# Patient Record
Sex: Male | Born: 1980 | Race: White | Hispanic: No | Marital: Married | Smoking: Never smoker
Health system: Southern US, Community
[De-identification: ages and names within clinical notes are randomized; demographics above are authoritative.]

## PROBLEM LIST (undated history)

## (undated) DIAGNOSIS — E079 Disorder of thyroid, unspecified: Secondary | ICD-10-CM

## (undated) HISTORY — DX: Disorder of thyroid, unspecified: E07.9

## (undated) HISTORY — DX: Gilbert syndrome: E80.4

## (undated) HISTORY — PX: KNEE SURGERY: SHX244

---

## 2017-05-20 ENCOUNTER — Encounter: Payer: Self-pay | Admitting: Osteopathic Medicine

## 2017-05-20 ENCOUNTER — Ambulatory Visit (INDEPENDENT_AMBULATORY_CARE_PROVIDER_SITE_OTHER): Payer: Managed Care, Other (non HMO) | Admitting: Osteopathic Medicine

## 2017-05-20 VITALS — BP 129/82 | HR 67 | Ht 73.0 in | Wt 231.0 lb

## 2017-05-20 DIAGNOSIS — S93402A Sprain of unspecified ligament of left ankle, initial encounter: Secondary | ICD-10-CM | POA: Insufficient documentation

## 2017-05-20 DIAGNOSIS — Z0000 Encounter for general adult medical examination without abnormal findings: Secondary | ICD-10-CM

## 2017-05-20 DIAGNOSIS — Z9852 Vasectomy status: Secondary | ICD-10-CM

## 2017-05-20 DIAGNOSIS — R6882 Decreased libido: Secondary | ICD-10-CM | POA: Insufficient documentation

## 2017-05-20 DIAGNOSIS — Z23 Encounter for immunization: Secondary | ICD-10-CM

## 2017-05-20 HISTORY — DX: Gilbert syndrome: E80.4

## 2017-05-20 NOTE — Progress Notes (Signed)
HPI: Ricky Marshall is a 36 y.o. male  who presents to [facility] today, 05/20/17,  for chief complaint of:  Chief Complaint  Patient presents with  . Establish Care    Annual, sprained left ankle    Here to establish care. 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 Brother is also a DO and an Secondary school teacher in [place]. He is married.   Patient here for annual physical / wellness exam.  See preventive care reviewed as below.  Recent labs/previous records: none available   Additional concerns today include:   Ankle injury . Context: was running and jumped off a retaining wall and landed on L foot, rolled ankle . Location: left ankle, lateral  . Quality: sore, no sharp pain  . Duration: <1 day . Modifying factors: iced it and tried Ibuprofen which helped   Low libido: Ongoing for the past year or so since vasectomy last October. Not sure if the 2 are related but seems to be getting a bit worse over that time. He is noticing some erectile dysfunction issues as well. He is to sit in sex and able to achieve erection at times but not as much as he used to.     Past medical, surgical, social and family history reviewed: Patient Active Problem List   Diagnosis Date Noted  . Status post vasectomy 05/20/2017  . Decreased libido 05/20/2017  . Mild sprain of left ankle 05/20/2017  . 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 KNEE SURGERY Right 2003, 2013     Social History  Substance Use Topics  . Smoking status: Never Smoker  . Smokeless tobacco: Never Used  . Alcohol use Not on file   Family History  Problem Relation Age of Onset  . Hypertension Father   . Hypertension Brother   . Thyroid disease Maternal Aunt   . Thyroid disease Maternal Grandmother   . Stroke Maternal Grandmother   . Diabetes Paternal Grandfather   . Cancer Paternal Grandfather      Current  medication list and allergy/intolerance information reviewed:   Current Outpatient Prescriptions  Medication Sig Dispense Refill  . SUMAtriptan (IMITREX) 25 MG tablet Take 25 mg by mouth every 2 (two) hours as needed for migraine. May repeat in 2 hours if headache persists or recurs.     No current facility-administered medications for this visit.    Allergies  Allergen Reactions  . Erythromycin Hives      Review of Systems:  Constitutional:  No  fever, no chills, No recent illness, No unintentional weight changes. No significant fatigue.   HEENT: No  headache, no vision change, no hearing change, No sore throat, No  sinus pressure  Cardiac: No  chest pain, No  pressure, No palpitations, No  Orthopnea  Respiratory:  No  shortness of breath. No  Cough  Gastrointestinal: No  abdominal pain, No  nausea, No  vomiting,  No  blood in stool, No  diarrhea, No  constipation   Musculoskeletal: +new myalgia/arthralgia  Genitourinary: No  incontinence, No  abnormal genital bleeding, No abnormal genital discharge  Skin: No  Rash, No other wounds/concerning lesions  Hem/Onc: No  easy bruising/bleeding  Endocrine: No cold intolerance,  No heat intolerance. No polyuria/polydipsia/polyphagia   Neurologic: No  weakness, No  dizziness  Psychiatric: No  concerns with depression, No  concerns with anxiety, No sleep problems, No mood problems  Exam:  BP 129/82   Pulse 67   Ht 6\' 1"  (1.854 m)   Wt 231 lb (104.8 kg)   BMI 30.48 kg/m   Constitutional: VS see above. General Appearance: alert, well-developed, well-nourished, NAD  Eyes: Normal lids and conjunctive, non-icteric sclera  Ears, Nose, Mouth, Throat: MMM, Normal external inspection ears/nares/mouth/lips/gums. TM normal bilaterally. Pharynx/tonsils no erythema, no exudate. Nasal mucosa normal.   Neck: No masses, trachea midline. No thyroid enlargement. No tenderness/mass appreciated. No lymphadenopathy  Respiratory: Normal  respiratory effort. no wheeze, no rhonchi, no rales  Cardiovascular: S1/S2 normal, no murmur, no rub/gallop auscultated. RRR. No lower extremity edema. .  Gastrointestinal: Nontender, no masses. No hepatomegaly, no splenomegaly.   Musculoskeletal: Gait normal. No clubbing/cyanosis of digits. Mild swelling of left lateral ankle without ecchymosis/effusion. No tenderness at lateral malleolus, calf squeeze test negative  Neurological: Normal balance/coordination. No tremor.   Skin: warm, dry, intact. No rash/ulcer. No concerning nevi or subq nodules on limited exam.    Psychiatric: Normal judgment/insight. Normal mood and affect. Oriented x3.     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      MALE PREVENTIVE CARE  updated 05/20/17  ANNUAL SCREENING/COUNSELING  Any changes to health in the past year/since last preventive care physical? See HPI  Diet/Exercise - HEALTHY HABITS DISCUSSED TO DECREASE CV RISK History  Smoking Status  . Never Smoker  Smokeless Tobacco  . Never Used   History  Alcohol use Not on file   Depression screen [facility] 2/9 05/20/2017  Decreased Interest 0  Down, Depressed,  Hopeless 0  PHQ - 2 Score 0    SEXUAL/REPRODUCTIVE HEALTH  Sexually active in the past year? - Yes with male.  STI testing needed/desired today? - no  Any concerns with testosterone/libido? - yes  INFECTIOUS DISEASE SCREENING  HIV - needs  GC/CT - does not need  HepC - does not need  TB - does not need  CANCER SCREENING  Lung - does not need  Colon - does not need  Prostate - does not need  OTHER DISEASE SCREENING  Lipid - needs  DM2 - needs  AAA - does not need  Osteoporosis - does not need  ADULT VACCINATION  Influenza - annual vaccine recommended  Td - booster every 10 years - states he had this in 2010  PCV13 - was not indicated  PPSV23 - was not indicated Immunization History  Administered Date(s) Administered  . Influenza,inj,Quad PF,6+ Mos 05/20/2017       Visit summary with medication list and pertinent instructions was printed for patient to review. All questions at time of visit were answered - patient instructed to contact office with any additional concerns.  ER/RTC precautions were reviewed with the patient. Follow-up plan: No Follow-up on file.  Note: Total time spent on problem-based portion of viist 30 minutes, greater than 50% of that time visit was spent face-to-face counseling and coordinating care for the following: Need for immunization against influenza, Gilbert syndrome, Mild sprain of left ankle, initial encounter, Decreased libido, and Status post vasectomy

## 2017-05-20 NOTE — Patient Instructions (Signed)
Plan:  See printed info for ankle care  Labs today for routine screening as well as workup of libido issues   If testosterone low, will require further testing and follow-up   Either way, let's check up in another 4-6 weeks or so to go over labs and recheck libido issues, come up with a plan based on labs!

## 2017-05-24 LAB — COMPLETE METABOLIC PANEL WITH GFR
AG Ratio: 1.4 (calc) (ref 1.0–2.5)
ALKALINE PHOSPHATASE (APISO): 70 U/L (ref 40–115)
ALT: 17 U/L (ref 9–46)
AST: 16 U/L (ref 10–40)
Albumin: 4.3 g/dL (ref 3.6–5.1)
BUN: 13 mg/dL (ref 7–25)
CO2: 28 mmol/L (ref 20–32)
CREATININE: 0.99 mg/dL (ref 0.60–1.35)
Calcium: 9.5 mg/dL (ref 8.6–10.3)
Chloride: 105 mmol/L (ref 98–110)
GFR, Est African American: 113 mL/min/{1.73_m2} (ref >=60)
GFR, Est Non African American: 98 mL/min/{1.73_m2} (ref >=60)
GLUCOSE: 89 mg/dL (ref 65–99)
Globulin: 3 g/dL (calc) (ref 1.9–3.7)
Potassium: 4.7 mmol/L (ref 3.5–5.3)
Sodium: 140 mmol/L (ref 135–146)
Total Bilirubin: 1.3 mg/dL — ABNORMAL HIGH (ref 0.2–1.2)
Total Protein: 7.3 g/dL (ref 6.1–8.1)

## 2017-05-24 LAB — CBC
HCT: 46.3 % (ref 38.5–50.0)
HEMOGLOBIN: 15.8 g/dL (ref 13.2–17.1)
MCH: 30 pg (ref 27.0–33.0)
MCHC: 34.1 g/dL (ref 32.0–36.0)
MCV: 87.9 fL (ref 80.0–100.0)
MPV: 10.9 fL (ref 7.5–12.5)
Platelets: 257 10*3/uL (ref 140–400)
RBC: 5.27 10*6/uL (ref 4.20–5.80)
RDW: 11.9 % (ref 11.0–15.0)
WBC: 6.3 10*3/uL (ref 3.8–10.8)

## 2017-05-24 LAB — T4, FREE: FREE T4: 1.1 ng/dL (ref 0.8–1.8)

## 2017-05-24 LAB — LIPID PANEL
CHOL/HDL RATIO: 3.4 (calc) (ref <5.0)
Cholesterol: 175 mg/dL (ref <200)
HDL: 52 mg/dL (ref >40)
LDL CHOLESTEROL (CALC): 104 mg/dL — AB
NON-HDL CHOLESTEROL (CALC): 123 mg/dL (ref <130)
Triglycerides: 91 mg/dL (ref <150)

## 2017-05-24 LAB — HIV ANTIBODY (ROUTINE TESTING W REFLEX): HIV 1&2 Ab, 4th Generation: NONREACTIVE

## 2017-05-24 LAB — TSH: TSH: 1.84 m[IU]/L (ref 0.40–4.50)

## 2017-05-24 LAB — VITAMIN D 25 HYDROXY (VIT D DEFICIENCY, FRACTURES): VIT D 25 HYDROXY: 41 ng/mL (ref 30–100)

## 2017-05-24 LAB — TESTOSTERONE: TESTOSTERONE: 550 ng/dL (ref 250–827)

## 2017-05-25 ENCOUNTER — Telehealth: Payer: Self-pay

## 2017-05-25 ENCOUNTER — Encounter: Payer: Self-pay | Admitting: Osteopathic Medicine

## 2017-05-25 MED ORDER — SUMATRIPTAN SUCCINATE 100 MG PO TABS: 50.0000 mg | ORAL_TABLET | Freq: Once | ORAL | 1 refills | Status: DC

## 2017-05-25 NOTE — Telephone Encounter (Signed)
Patient called stated that he was given Imitrex 25 mg when he was in for his office visit but he was previously on the 100 mg. Patient is requesting a dose change. Please advise. Rhonda Cunningham,CMA

## 2017-05-26 ENCOUNTER — Other Ambulatory Visit: Payer: Self-pay | Admitting: Osteopathic Medicine

## 2017-05-26 MED ORDER — SILDENAFIL CITRATE 20 MG PO TABS: 20.0000 mg | ORAL_TABLET | Freq: Every day | ORAL | 1 refills | Status: DC | PRN

## 2017-05-26 NOTE — Progress Notes (Signed)
ED Rx

## 2017-06-06 DIAGNOSIS — R7989 Other specified abnormal findings of blood chemistry: Secondary | ICD-10-CM | POA: Insufficient documentation

## 2017-06-23 ENCOUNTER — Encounter: Payer: Self-pay | Admitting: Osteopathic Medicine

## 2017-06-23 ENCOUNTER — Ambulatory Visit (INDEPENDENT_AMBULATORY_CARE_PROVIDER_SITE_OTHER): Payer: Managed Care, Other (non HMO) | Admitting: Osteopathic Medicine

## 2017-06-23 VITALS — BP 118/86 | HR 98 | Ht 72.0 in | Wt 229.0 lb

## 2017-06-23 DIAGNOSIS — R6882 Decreased libido: Secondary | ICD-10-CM | POA: Diagnosis not present

## 2017-06-23 DIAGNOSIS — Z9852 Vasectomy status: Secondary | ICD-10-CM | POA: Diagnosis not present

## 2017-06-23 NOTE — Progress Notes (Signed)
HPI: Ricky Marshall is a 36 y.o. male  who presents to [facility] today, 06/23/17,  for chief complaint of:  Chief Complaint  Patient presents with  . 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 Brother is also a DO and an Secondary school teacher in [place]. He is married.   Patient was here about a month ago to establish care. At that point had some concerns about an ankle injury and low libido.  Libido: At this point has been ongoing for the past year or so, noticed after vasectomy last October.He is noticing some erectile dysfunction issues as well. He is interested in sex and able to achieve erection at times but not as much as he used to. Labs showed normal sugars, normal renal/hepatic function, no anemia, no thyroid disorder, testosterone within normal range. No depression. Thinks he might be getting anxious.    Ankle injury: doing better.     Past medical, surgical, social and family history reviewed: Patient Active Problem List   Diagnosis Date Noted  . Low vitamin D level 06/06/2017  . Status post vasectomy 05/20/2017  . Decreased libido 05/20/2017  . Mild sprain of left ankle 05/20/2017  . 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 KNEE SURGERY Right 2003, 2013     Social History  Substance Use Topics  . Smoking status: Never Smoker  . Smokeless tobacco: Never Used  . Alcohol use Not on file   Family History  Problem Relation Age of Onset  . Hypertension Father   . Hypertension Brother   . Thyroid disease Maternal Aunt   . Thyroid disease Maternal Grandmother   . Stroke Maternal Grandmother   . Diabetes Paternal Grandfather   . Cancer Paternal Grandfather      Current medication list and allergy/intolerance information reviewed:   Current Outpatient Prescriptions  Medication Sig Dispense Refill  . sildenafil (REVATIO) 20 MG  tablet Take 1-5 tablets (20-100 mg total) by mouth daily as needed (prior to sex. Use lowest effective dose). 90 tablet 1  . SUMAtriptan (IMITREX) 100 MG tablet Take 0.5-1 tablets (50-100 mg total) by mouth once. May repeat in 2 hours if headache persists or recurs. 20 tablet 1   No current facility-administered medications for this visit.    Allergies  Allergen Reactions  . Erythromycin Hives      Review of Systems:  Constitutional: No recent illness  Cardiac: No  chest pain, No  pressure  Respiratory:  No  shortness of breath.  Gastrointestinal: No  abdominal pain  Neurologic: No  weakness, No  dizziness  Exam:  BP 118/86   Pulse 98   Ht 6' (1.829 m)   Wt 229 lb (103.9 kg)   BMI 31.06 kg/m   Constitutional: VS see above. General Appearance: alert, well-developed, well-nourished, NAD  Eyes: Normal lids and conjunctive, non-icteric sclera  Neck: No masses, trachea midline.   Respiratory: Normal respiratory effort.  Musculoskeletal: Gait normal.   Neurological: Normal balance/coordination.   Psychiatric: Normal judgment/insight. Normal mood and affect. Oriented x3.   Recent Results (from the past 2160 hour(s))  CBC     Status: None   Collection Time: 05/23/17  8:19 AM  Result Value Ref Range   WBC 6.3 3.8 - 10.8 Thousand/uL   RBC 5.27 4.20 - 5.80 Million/uL   Hemoglobin 15.8 13.2 - 17.1 g/dL   HCT 46.3  38.5 - 50.0 %   MCV 87.9 80.0 - 100.0 fL   MCH 30.0 27.0 - 33.0 pg   MCHC 34.1 32.0 - 36.0 g/dL   RDW 11.9 11.0 - 15.0 %   Platelets 257 140 - 400 Thousand/uL   MPV 10.9 7.5 - 12.5 fL  Lipid panel     Status: Abnormal   Collection Time: 05/23/17  8:19 AM  Result Value Ref Range   Cholesterol 175 <200 mg/dL   HDL 52 >40 mg/dL   Triglycerides 91 <150 mg/dL   LDL Cholesterol (Calc) 104 (H) mg/dL (calc)    Comment: Reference range: <100 . Desirable range <100 mg/dL for primary prevention;   <70 mg/dL for patients with CHD or diabetic patients  with > or  = 2 CHD risk factors. Marland Kitchen LDL-C is now calculated using the Martin-Hopkins  calculation, which is a validated novel method providing  better accuracy than the Friedewald equation in the  estimation of LDL-C.  Cresenciano Genre et al. Annamaria Helling. 5361;443(15): 2061-2068  (http://education.[facility].com/faq/FAQ164)    Total CHOL/HDL Ratio 3.4 <5.0 (calc)   Non-HDL Cholesterol (Calc) 123 <130 mg/dL (calc)    Comment: For patients with diabetes plus 1 major ASCVD risk  factor, treating to a non-HDL-C goal of <100 mg/dL  (LDL-C of <70 mg/dL) is considered a therapeutic  option.   COMPLETE METABOLIC PANEL WITH GFR     Status: Abnormal   Collection Time: 05/23/17  8:19 AM  Result Value Ref Range   Glucose, Bld 89 65 - 99 mg/dL    Comment: .            Fasting reference interval .    BUN 13 7 - 25 mg/dL   Creat 0.99 0.60 - 1.35 mg/dL   GFR, Est Non African American 98 > OR = 60 mL/min/1.20m2   GFR, Est African American 113 > OR = 60 mL/min/1.14m2   BUN/Creatinine Ratio NOT APPLICABLE 6 - 22 (calc)   Sodium 140 135 - 146 mmol/L   Potassium 4.7 3.5 - 5.3 mmol/L   Chloride 105 98 - 110 mmol/L   CO2 28 20 - 32 mmol/L   Calcium 9.5 8.6 - 10.3 mg/dL   Total Protein 7.3 6.1 - 8.1 g/dL   Albumin 4.3 3.6 - 5.1 g/dL   Globulin 3.0 1.9 - 3.7 g/dL (calc)   AG Ratio 1.4 1.0 - 2.5 (calc)   Total Bilirubin 1.3 (H) 0.2 - 1.2 mg/dL   Alkaline phosphatase (APISO) 70 40 - 115 U/L   AST 16 10 - 40 U/L   ALT 17 9 - 46 U/L  TSH     Status: None   Collection Time: 05/23/17  8:19 AM  Result Value Ref Range   TSH 1.84 0.40 - 4.50 mIU/L  T4, free     Status: None   Collection Time: 05/23/17  8:19 AM  Result Value Ref Range   Free T4 1.1 0.8 - 1.8 ng/dL  VITAMIN D 25 Hydroxy (Vit-D Deficiency, Fractures)     Status: None   Collection Time: 05/23/17  8:19 AM  Result Value Ref Range   Vit D, 25-Hydroxy 41 30 - 100 ng/mL    Comment: Vitamin D Status         25-OH Vitamin D: . Deficiency:                     <20 ng/mL Insufficiency:             20 -  29 ng/mL Optimal:                 > or = 30 ng/mL . For 25-OH Vitamin D testing on patients on  D2-supplementation and patients for whom quantitation  of D2 and D3 fractions is required, the QuestAssureD(TM) 25-OH VIT D, (D2,D3), LC/MS/MS is recommended: order  code 959 [id number] (patients >62yrs). . For more information on this test, go to: http://education.[facility].com/faq/FAQ163 (This link is being provided for  informational/educational purposes only.)   Testosterone     Status: None   Collection Time: 05/23/17  8:19 AM  Result Value Ref Range   Testosterone 550 250 - 827 ng/dL  HIV antibody     Status: None   Collection Time: 05/23/17  8:19 AM  Result Value Ref Range   HIV 1&2 Ab, 4th Generation NON-REACTIVE NON-REACTI    Comment: HIV-1 antigen and HIV-1/HIV-2 antibodies were not detected. There is no laboratory evidence of HIV infection. Marland Kitchen PLEASE NOTE: This information has been disclosed to you from records whose confidentiality may be protected by state law.  If your state requires such protection, then the state law prohibits you from making any further disclosure of the information without the specific written consent of the person to whom it pertains, or as otherwise permitted by law. A general authorization for the release of medical or other information is NOT sufficient for this purpose. . For additional information please refer to http://education.[facility].com/faq/FAQ106 (This link is being provided for informational/ educational purposes only.) . Marland Kitchen The performance of this assay has not been clinically validated in patients less than 90 years old. .      ASSESSMENT/PLAN:   Decreased libido - trial sildenafil, consider refer sex therapy/counseling, consider urology referral   Status post vasectomy  Rosanna Randy syndrome       Visit summary with medication list and pertinent instructions was printed  for patient to review. All questions at time of visit were answered - patient instructed to contact office with any additional concerns. ER/RTC precautions were reviewed with the patient. Follow-up plan: Return if symptoms worsen or fail to improve.   Total time spent 15 minutes, greater than 50% of the visit was face to face counseling and coordinating care for diagnosis of The primary encounter diagnosis was Decreased libido. Diagnoses of Status post vasectomy and Rosanna Randy syndrome were also pertinent to this visit. Marland Kitchen

## 2017-07-12 ENCOUNTER — Ambulatory Visit (HOSPITAL_BASED_OUTPATIENT_CLINIC_OR_DEPARTMENT_OTHER)
Admission: RE | Admit: 2017-07-12 | Discharge: 2017-07-12 | Disposition: A | Discharge status: Home / Self Care | Payer: Managed Care, Other (non HMO) | Source: Ambulatory Visit | Attending: Osteopathic Medicine | Admitting: Osteopathic Medicine

## 2017-07-12 ENCOUNTER — Ambulatory Visit (INDEPENDENT_AMBULATORY_CARE_PROVIDER_SITE_OTHER): Payer: Managed Care, Other (non HMO) | Admitting: Osteopathic Medicine

## 2017-07-12 ENCOUNTER — Encounter: Payer: Self-pay | Admitting: Osteopathic Medicine

## 2017-07-12 VITALS — BP 122/82 | HR 66 | Wt 232.0 lb

## 2017-07-12 DIAGNOSIS — N50812 Left testicular pain: Secondary | ICD-10-CM

## 2017-07-12 DIAGNOSIS — Z8639 Personal history of other endocrine, nutritional and metabolic disease: Secondary | ICD-10-CM | POA: Insufficient documentation

## 2017-07-12 NOTE — Patient Instructions (Signed)
Varicocele A varicocele is a swelling of veins in the scrotum. The scrotum is the sac that contains the testicles. Varicoceles can occur on either side of the scrotum, but they are more common on the left side. They occur most often in teenage boys and young men. In most cases, varicoceles are not a serious problem. They are usually small and painless and do not require treatment. Tests may be done to confirm the diagnosis. Treatment may be needed if:  A varicocele is large, causes a lot of pain, or causes pain when exercising.  Varicoceles are found on both sides of the scrotum.  The testicle on the opposite side is absent or not normal.  A varicocele causes a decrease in the size of the testicle in a growing adolescent.  The person has fertility problems.  What are the causes? This condition is the result of valves in the veins not working properly. Valves in the veins help to return blood from the scrotum and testicles to the heart. If these valves do not work well, blood flows backward and backs up into the veins, which causes the veins to swell. This is similar to what happens when varicose veins form in the leg. What are the signs or symptoms? Most varicoceles do not cause any symptoms. If symptoms do occur, they may include:  Swelling on one side of the scrotum. The swelling may be more obvious when you are standing up.  A lumpy feeling in the scrotum.  A heavy feeling on one side of the scrotum.  A dull ache in the scrotum, especially after exercise or prolonged standing or sitting.  Slower growth or reduced size of the testicle on the side of the varicocele (in young males).  Problems with fertility. These can occur if the testicle does not grow normally.  How is this diagnosed? This condition may be diagnosed with a physical exam. You may also have an imaging test, called an ultrasound, to confirm the diagnosis and to help rule out other causes of the swelling. How is this  treated? Treatment is usually not needed for this condition. If you have any pain, your health care provider may prescribe or recommend medicine to help relieve it. You may need regular exams so your health care provider can monitor the varicocele to ensure that it does not cause problems. When further treatment is needed, it may involve one of these options:  Varicocelectomy. This is a surgery in which the swollen veins are tied off so that the flow of blood goes to other veins instead.  Embolization. In this procedure, a small tube (catheter) is used to place metal coils or other blocking items in the veins. This cuts off the blood flow to the swollen veins.  Follow these instructions at home:  Take medicines only as directed by your health care provider.  Wear supportive underwear.  Use an athletic supporter for sports.  Keep all follow-up visits as directed by your health care provider. This is important. Contact a health care provider if:  Your pain is increasing.  You have redness in the affected area.  You have swelling that does not decrease when you are lying down.  One of your testicles is smaller than the other.  Your testicle becomes enlarged, swollen, or painful. This information is not intended to replace advice given to you by your health care provider. Make sure you discuss any questions you have with your health care provider. Document Released: 12/06/2000 Document Revised: 02/11/2016  Document Reviewed: 08/07/2014 Elsevier Interactive Patient Education  2018 [employer]  

## 2017-07-12 NOTE — Progress Notes (Signed)
HPI: Ricky Marshall is a 36 y.o. 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  who presents to [facility] today, 07/12/17,  for chief complaint of:  Chief Complaint  Patient presents with  . Other    pain at testicles    . Location: lower abdomen, feels like connected to testicle on L . Marland Kitchen Quality: feels like soreness when kicked, but obviously no trauma. Yesterday was swollen and war, today is better but still tender.  . Duration: 1 day  . Modifying factors: Ibuprofen and briefs > boxers helped  . Context: no history of lifting anything heavy . Assoc signs/symptoms: no fever/chills. No trauma, no lifting. No     Past medical, surgical, social and family history reviewed:  Patient Active Problem List   Diagnosis Date Noted  . Low vitamin D level 06/06/2017  . Status post vasectomy 05/20/2017  . Decreased libido 05/20/2017  . Mild sprain of left ankle 05/20/2017  . Rosanna Randy syndrome 05/20/2017    Past Surgical History:  Procedure Laterality Date  . KNEE SURGERY Right 2003, 2013    Social History  Substance Use Topics  . Smoking status: Never Smoker  . Smokeless tobacco: Never Used  . Alcohol use Not on file    Family History  Problem Relation Age of Onset  . Hypertension Father   . Hypertension Brother   . Thyroid disease Maternal Aunt   . Thyroid disease Maternal Grandmother   . Stroke Maternal Grandmother   . Diabetes Paternal Grandfather   . Cancer Paternal Grandfather      Current medication list and allergy/intolerance information reviewed:    Current Outpatient Prescriptions  Medication Sig Dispense Refill  . sildenafil (REVATIO) 20 MG tablet Take 1-5 tablets (20-100 mg total) by mouth daily as needed (prior to sex. Use lowest effective dose). 90 tablet 1  . SUMAtriptan (IMITREX) 100 MG tablet Take 0.5-1 tablets (50-100 mg total) by mouth once. May repeat in 2 hours if  headache persists or recurs. 20 tablet 1   No current facility-administered medications for this visit.     Allergies  Allergen Reactions  . Erythromycin Hives      Review of Systems:  Constitutional:  No  fever, no chills  Cardiac: No  chest pain  Respiratory:  No  shortness of breath.   Gastrointestinal: +lower abdominal pain, No  nausea, No  vomiting,  No  blood in stool, No  diarrhea, No  constipation   Musculoskeletal: No new myalgia/arthralgia  Genitourinary: No  incontinence, No  abnormal genital bleeding, No abnormal genital discharge  Skin: No  Rash,    Exam:  BP 122/82   Pulse 66   Wt 232 lb (105.2 kg)   BMI 31.46 kg/m   Constitutional: VS see above. General Appearance: alert, well-developed, well-nourished, NAD  Neck: No masses, trachea midline.  Respiratory: Normal respiratory effort.   Gastrointestinal: Nontender, no masses. No hepatomegaly, no splenomegaly. No hernia appreciated. Bowel sounds normal. Rectal exam deferred.   Musculoskeletal: Gait normal.   GU: tenderness L testicle without hernia bilaterally   Skin: warm, dry, intact. No rash/ulcer.   Psychiatric: Normal judgment/insight. Normal mood and affect. Oriented x3.       ASSESSMENT/PLAN: Appreciate assistance from Dr. Darene Lamer with verification of exam. Suspect most likely varicocele possible epididymal abnormality - reassuring that there was improvement and no other associated symptoms of concern.   Testicular pain, left - Plan: US  Scrotum, US SCROTUM DOPPLER     Visit summary with medication list and pertinent instructions was printed for patient to review. All questions at time of visit were answered - patient instructed to contact office with any additional concerns. ER/RTC precautions were reviewed with the patient. Follow-up plan: Return if symptoms worsen or fail to improve.  Note: Total time spent was minutes, greater than 50% of the visit was spent face-to-face counseling and  coordinating care for the following: The encounter diagnosis was Testicular pain, left..  Please note: voice recognition software was used to produce this document, and typos may escape review. Please contact me for any needed clarifications.     Addendum: 07/13/17   US Scrotum  Result Date: 07/12/2017 CLINICAL DATA:  Left scrotal pain beginning yesterday.  Swelling. EXAM: SCROTAL ULTRASOUND DOPPLER ULTRASOUND OF THE TESTICLES TECHNIQUE: Complete ultrasound examination of the testicles, epididymis, and other scrotal structures was performed. Color and spectral Doppler ultrasound were also utilized to evaluate blood flow to the testicles. COMPARISON:  None. FINDINGS: Right testicle Measurements: 5.4 x 2.7 x 4.1 cm, within normal limits. No mass or microlithiasis visualized. Left testicle Measurements: 5.3 x 3.0 x 4.2 cm, within normal limits. No mass or microlithiasis visualized. There is hyperemia to the left testicle compared to the right. Doppler waveforms are within normal limits. Right epididymis:  Normal in size and appearance. Left epididymis:  Normal in size and appearance. Hydrocele:  None visualized. Varicocele:  None visualized. Pulsed Doppler interrogation of both testes demonstrates normal low resistance arterial and venous waveforms bilaterally. IMPRESSION: 1. Hyperemia of the left testicle. This may reflect orchitis. No discrete lesion is present. 2. Normal appearance of the right testicle. 3. Normal arterial and venous waveforms bilaterally. Electronically Signed   By: San Morelle M.D.   On: 07/12/2017 17:10   US Scrotum Doppler  Result Date: 07/12/2017 CLINICAL DATA:  Left scrotal pain beginning yesterday.  Swelling. EXAM: SCROTAL ULTRASOUND DOPPLER ULTRASOUND OF THE TESTICLES TECHNIQUE: Complete ultrasound examination of the testicles, epididymis, and other scrotal structures was performed. Color and spectral Doppler ultrasound were also utilized to evaluate blood flow to  the testicles. COMPARISON:  None. FINDINGS: Right testicle Measurements: 5.4 x 2.7 x 4.1 cm, within normal limits. No mass or microlithiasis visualized. Left testicle Measurements: 5.3 x 3.0 x 4.2 cm, within normal limits. No mass or microlithiasis visualized. There is hyperemia to the left testicle compared to the right. Doppler waveforms are within normal limits. Right epididymis:  Normal in size and appearance. Left epididymis:  Normal in size and appearance. Hydrocele:  None visualized. Varicocele:  None visualized. Pulsed Doppler interrogation of both testes demonstrates normal low resistance arterial and venous waveforms bilaterally. IMPRESSION: 1. Hyperemia of the left testicle. This may reflect orchitis. No discrete lesion is present. 2. Normal appearance of the right testicle. 3. Normal arterial and venous waveforms bilaterally. Electronically Signed   By: San Morelle M.D.   On: 07/12/2017 17:10   Possible orchitis - called patient to discuss results   Called patient - testicle still tender/painful, better a bit but not resolved. No fever. UpToDate reviewed (Evaluation of acute scrotal pain in adults Author: Nani Gasser, MD Jan 08, 2017 updated) recommended Levofloxacin 500 daily for 75+ yo low risk, pt does not engage in anal sex. Will send abx though of course might be viral. If worse/change will send to urology

## 2017-07-13 MED ORDER — LEVOFLOXACIN 500 MG PO TABS: 500.0000 mg | ORAL_TABLET | Freq: Every day | ORAL | 0 refills | Status: DC

## 2018-11-21 IMAGING — US US SCROTUM
1 series · 13 of 25 positions shown · non-contrast
Comparison: None.

CLINICAL DATA: Left scrotal pain beginning yesterday.  Swelling.

EXAM:
SCROTAL ULTRASOUND
DOPPLER ULTRASOUND OF THE TESTICLES
TECHNIQUE: Complete ultrasound examination of the testicles, epididymis, and
other scrotal structures was performed. Color and spectral Doppler
ultrasound were also utilized to evaluate blood flow to the
testicles.

[Series 1: us scrotum · 0.09mm/px · 13 of 31 slices shown]
[im 1/31]
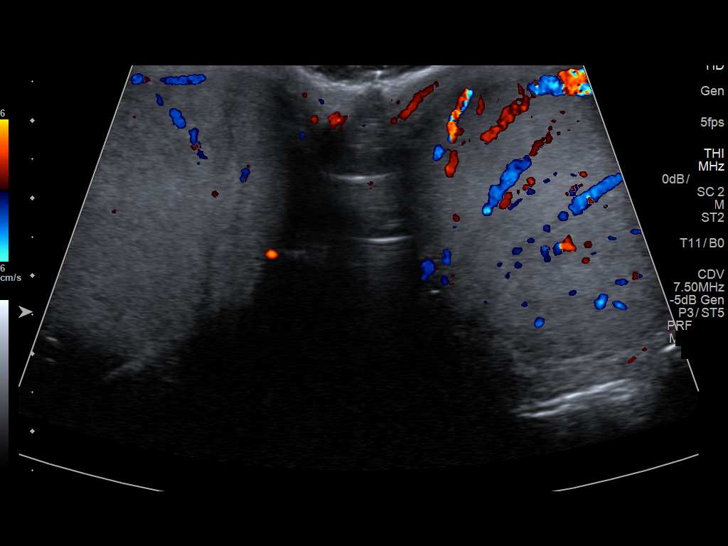
[im 3/31]
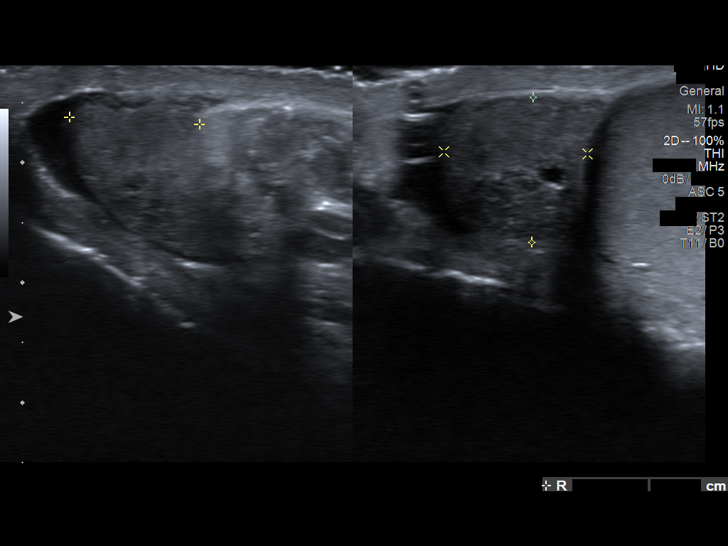
[im 6/31]
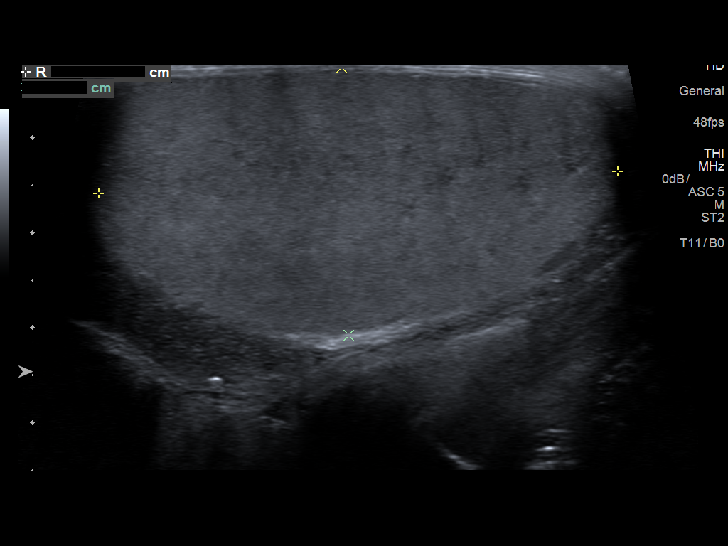
[im 8/31]
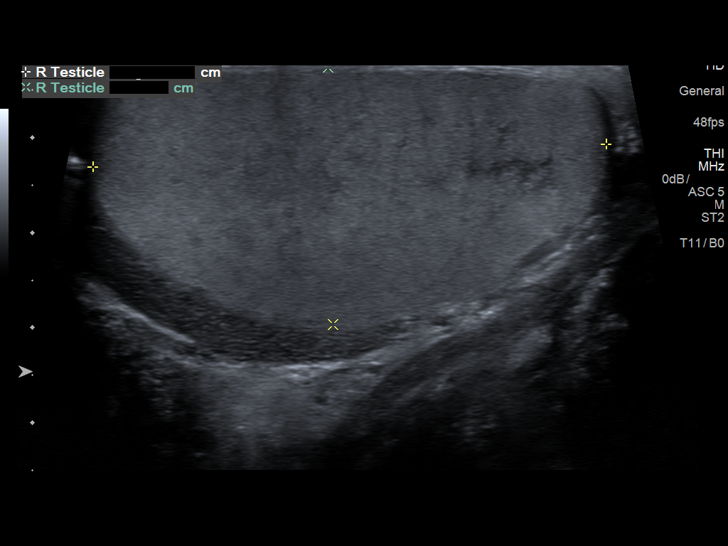
[im 11/31]
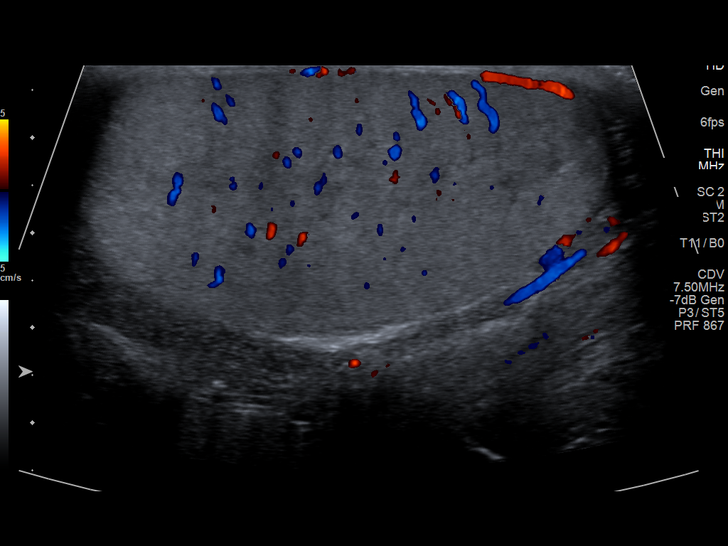
[im 13/31]
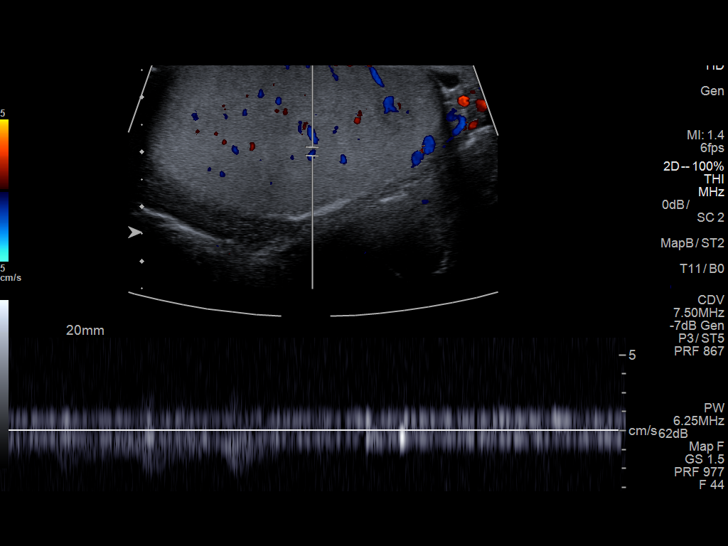
[im 16/31]
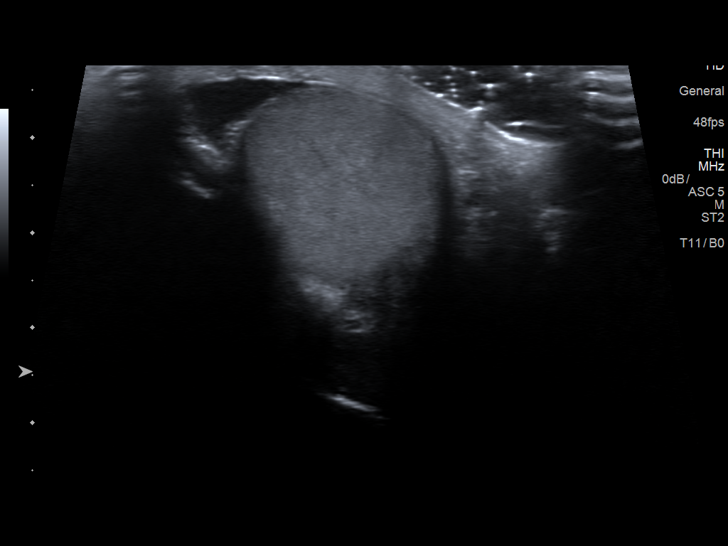
[im 18/31]
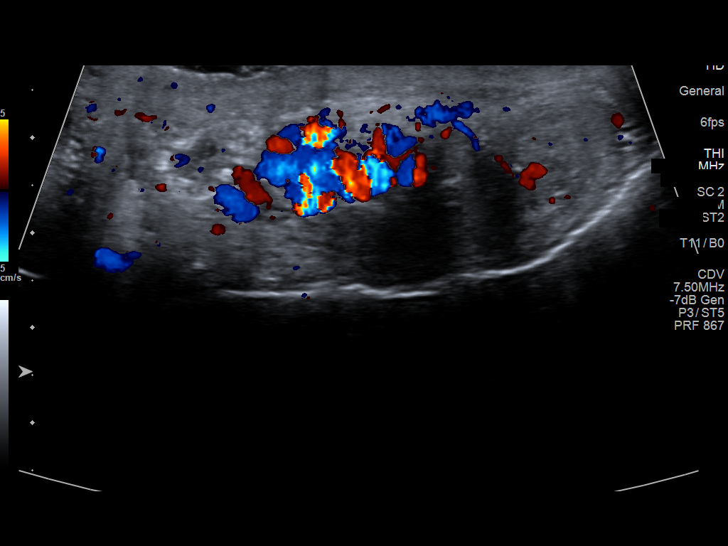
[im 21/31]
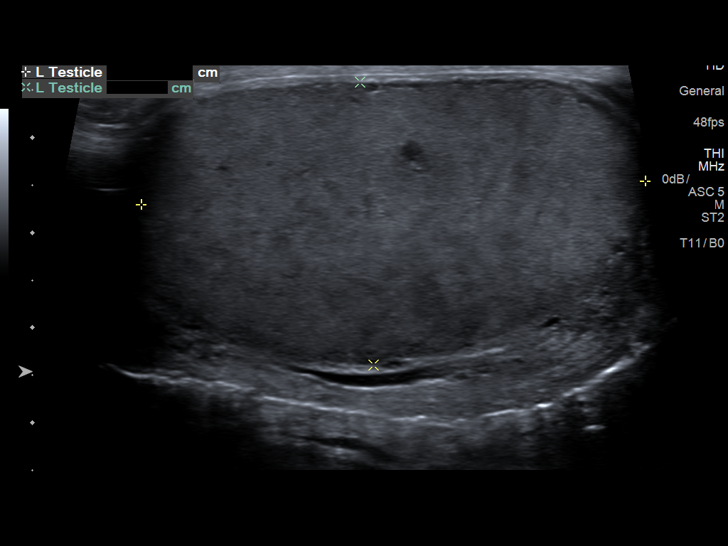
[im 23/31]
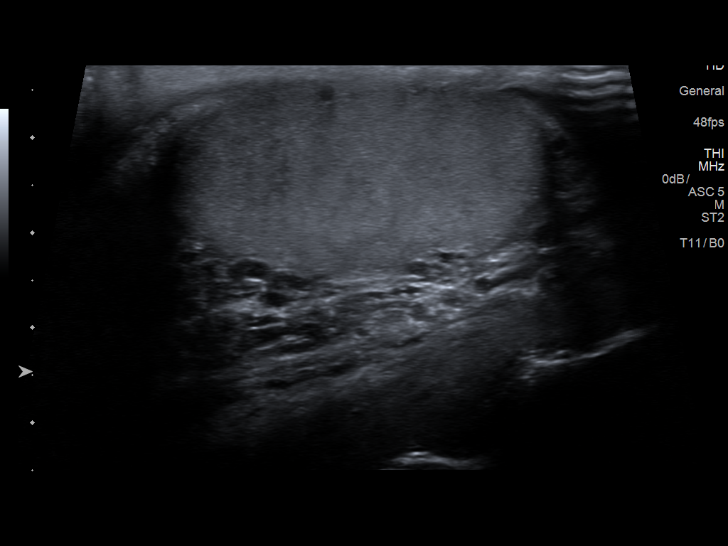
[im 26/31]
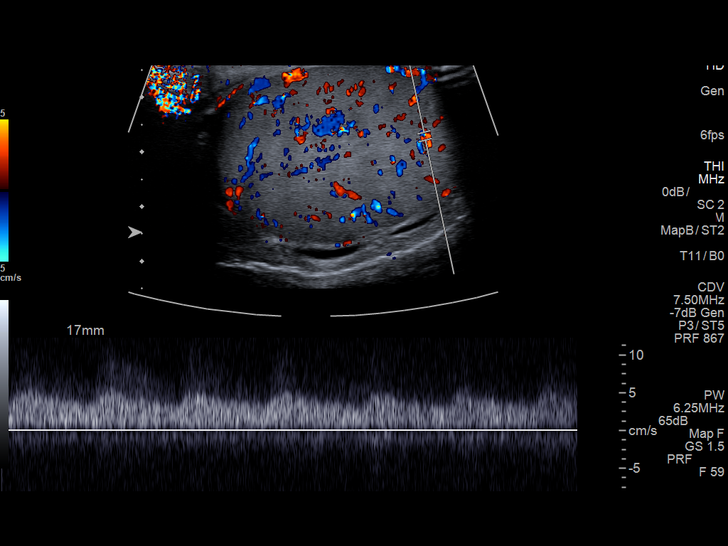
[im 28/31]
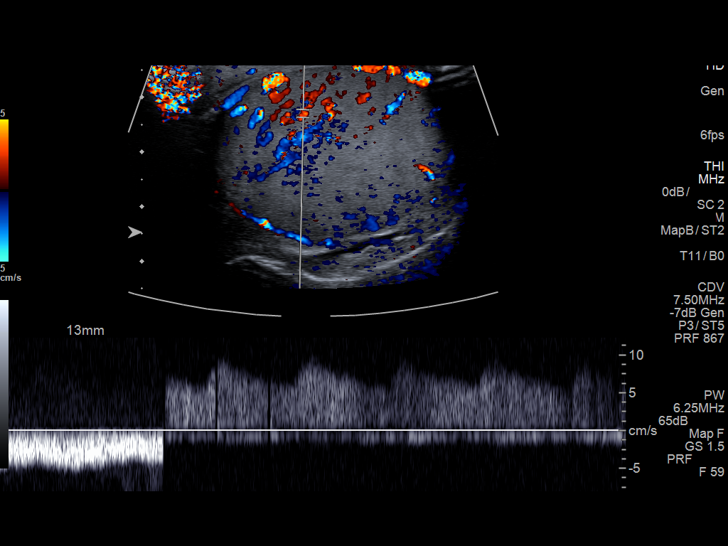
[im 31/31]
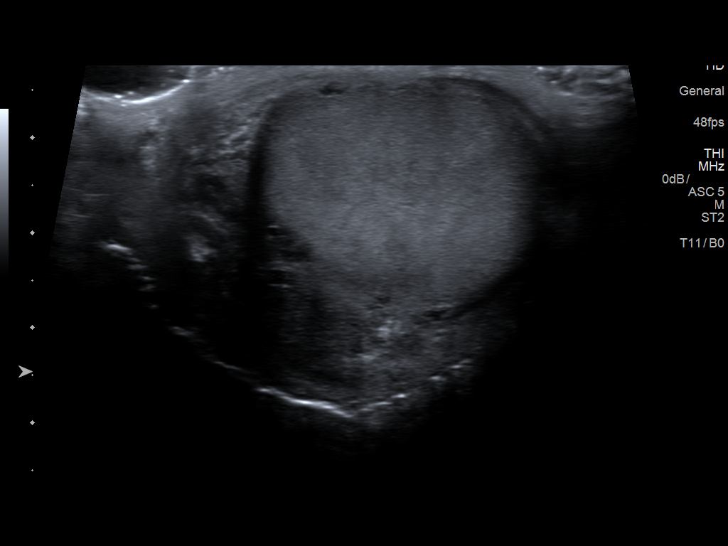

[13 of 25 positions shown; findings below may reference images not displayed]

FINDINGS: Right testicle

Measurements: 5.4 x 2.7 x 4.1 cm, within normal limits. No mass or
microlithiasis visualized.

Left testicle

Measurements: 5.3 x 3.0 x 4.2 cm, within normal limits. No mass or
microlithiasis visualized. There is hyperemia to the left testicle
compared to the right. Doppler waveforms are within normal limits.

Right epididymis:  Normal in size and appearance.

Left epididymis:  Normal in size and appearance.

Hydrocele:  None visualized.

Varicocele:  None visualized.

Pulsed Doppler interrogation of both testes demonstrates normal low
resistance arterial and venous waveforms bilaterally.
IMPRESSION: 1. Hyperemia of the left testicle. This may reflect orchitis. No
discrete lesion is present.
2. Normal appearance of the right testicle.
3. Normal arterial and venous waveforms bilaterally.

## 2019-04-17 ENCOUNTER — Encounter: Payer: Self-pay | Admitting: Osteopathic Medicine

## 2019-04-17 ENCOUNTER — Other Ambulatory Visit: Payer: Self-pay

## 2019-04-17 ENCOUNTER — Ambulatory Visit (INDEPENDENT_AMBULATORY_CARE_PROVIDER_SITE_OTHER): Payer: Managed Care, Other (non HMO) | Admitting: Osteopathic Medicine

## 2019-04-17 VITALS — BP 128/74 | HR 55 | Temp 98.2°F | Wt 217.8 lb

## 2019-04-17 DIAGNOSIS — Z800 Family history of malignant neoplasm of digestive organs: Secondary | ICD-10-CM

## 2019-04-17 DIAGNOSIS — Z8639 Personal history of other endocrine, nutritional and metabolic disease: Secondary | ICD-10-CM | POA: Diagnosis not present

## 2019-04-17 DIAGNOSIS — Z23 Encounter for immunization: Secondary | ICD-10-CM | POA: Diagnosis not present

## 2019-04-17 DIAGNOSIS — Z0000 Encounter for general adult medical examination without abnormal findings: Secondary | ICD-10-CM

## 2019-04-17 MED ORDER — SILDENAFIL CITRATE 20 MG PO TABS: 20.0000 mg | ORAL_TABLET | Freq: Every day | ORAL | 1 refills | Status: DC | PRN

## 2019-04-17 MED ORDER — SUMATRIPTAN SUCCINATE 100 MG PO TABS: 50.0000 mg | ORAL_TABLET | Freq: Once | ORAL | 1 refills | Status: AC

## 2019-04-17 NOTE — Patient Instructions (Addendum)
General Preventive Care  Most recent routine screening labs: ordered today   Everyone should have blood pressure checked once per year.   Tobacco: don't!   Alcohol: responsible moderation is ok for most adults - if you have concerns about your alcohol intake, please talk to me!   Exercise: as tolerated to reduce risk of cardiovascular disease and diabetes. Strength training will also prevent osteoporosis.   Mental health: if need for mental health care (medicines, counseling, other), or concerns about moods, please let me know!   Sexual health: if need for STD testing, or if concerns with libido/pain problems, please let me know!   Advanced Directive: Living Will and/or Healthcare Power of Attorney recommended for all adults, regardless of age or health.  Vaccines  Flu vaccine: recommended for almost everyone, every fall.   Shingles vaccine: Shingrix recommended after age 16.   Pneumonia vaccines: Prevnar and Pneumovax recommended after age 70.   Tetanus booster: Tdap recommended every 10 years.  Cancer screenings   Colon cancer screening: recommended for everyone at age 39, but some folks need a colonoscopy sooner if risk factors   Prostate cancer screening: PSA blood test around age 39  Lung cancer screening: not needed for non-smokers  Infection screenings . HIV: recommended screening at least once age 23-65, more often as needed. . Gonorrhea/Chlamydia: screening as needed. . Hepatitis C: recommended for anyone born 14-1966 . TB: certain at-risk populations, or depending on work requirements and/or travel history Other . Bone Density Test: recommended for men at age 68 . Abdominal Aortic Aneurysm: screening with ultrasound recommended once for men age 98-75 who have ever smoked

## 2019-04-17 NOTE — Progress Notes (Signed)
HPI: Ricky Marshall is a 38 y.o. 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  he presents to [facility] today, 04/17/19,  for chief complaint of: Annual physical     Patient here for annual physical / wellness exam.  See preventive care reviewed as below.   Additional concerns today include:   Concerns about family history of colon cancer.  Paternal uncle died in his 23s, dad has never been screened and is resistant to getting a colonoscopy.  Mom and several of her sisters have had colon polyps, he is unsure how aggressive these are.  He would like to consult with a gastroenterologist about possible early screening.   Past medical, surgical, social and family history reviewed:  Patient Active Problem List   Diagnosis Date Noted  . History of thyroid disorder 07/12/2017  . Low vitamin D level 06/06/2017  . Status post vasectomy 05/20/2017  . Decreased libido 05/20/2017  . Mild sprain of left ankle 05/20/2017  . Rosanna Randy syndrome 05/20/2017    Past Surgical History:  Procedure Laterality Date  . KNEE SURGERY Right 2003, 2013    Social History   Tobacco Use  . Smoking status: Never Smoker  . Smokeless tobacco: Never Used  Substance Use Topics  . Alcohol use: Yes    Comment: few beers per week     Family History  Problem Relation Age of Onset  . Hypertension Father   . Hypertension Brother   . Thyroid disease Maternal Aunt   . Thyroid disease Maternal Grandmother   . Stroke Maternal Grandmother   . Diabetes Paternal Grandfather   . Cancer Paternal Grandfather   . Colon polyps Mother   . Colon cancer Paternal Uncle      Current medication list and allergy/intolerance information reviewed:    Current Outpatient Medications  Medication Sig Dispense Refill  . sildenafil (REVATIO) 20 MG tablet Take 1-5 tablets (20-100 mg total) by mouth daily as needed (prior to sex. Use lowest effective  dose). 90 tablet 1  . SUMAtriptan (IMITREX) 100 MG tablet Take 0.5-1 tablets (50-100 mg total) by mouth once for 1 dose. May repeat in 2 hours if headache persists or recurs. 20 tablet 1   No current facility-administered medications for this visit.     Allergies  Allergen Reactions  . Erythromycin Hives      Review of Systems:  Constitutional:  No  fever, no chills, No recent illness, No unintentional weight changes. No significant fatigue.   HEENT: No  headache, no vision change, no hearing change, No sore throat, No  sinus pressure  Cardiac: No  chest pain, No  pressure, No palpitations, No  Orthopnea  Respiratory:  No  shortness of breath. No  Cough  Gastrointestinal: No  abdominal pain, No  nausea, No  vomiting,  No  blood in stool, No  diarrhea, No  constipation   Musculoskeletal: No new myalgia/arthralgia  Skin: No  Rash, No other wounds/concerning lesions  Genitourinary: No  incontinence, No  abnormal genital bleeding, No abnormal genital discharge  Hem/Onc: No  easy bruising/bleeding, No  abnormal lymph node  Endocrine: No cold intolerance,  No heat intolerance. No polyuria/polydipsia/polyphagia   Neurologic: No  weakness, No  dizziness, No  slurred speech/focal weakness/facial droop  Psychiatric: No  concerns with depression, No  concerns with anxiety, No sleep problems, No mood problems  Exam:  BP 128/74 (BP Location: Left Arm, Patient Position: Sitting, Cuff  Size: Normal)   Pulse (!) 55   Temp 98.2 F (36.8 C) (Oral)   Wt 217 lb 12.8 oz (98.8 kg)   BMI 29.54 kg/m   Constitutional: VS see above. General Appearance: alert, well-developed, well-nourished, NAD  Eyes: Normal lids and conjunctive, non-icteric sclera  Neck: No masses, trachea midline. No thyroid enlargement. No tenderness/mass appreciated. No lymphadenopathy  Respiratory: Normal respiratory effort. no wheeze, no rhonchi, no rales  Cardiovascular: S1/S2 normal, no murmur, no rub/gallop  auscultated. RRR. No lower extremity edema.   Gastrointestinal: Nontender, no masses. No hepatomegaly, no splenomegaly. No hernia appreciated. Bowel sounds normal. Rectal exam deferred.   Musculoskeletal: Gait normal. No clubbing/cyanosis of digits.   Neurological: Normal balance/coordination. No tremor. No cranial nerve deficit on limited exam. Motor and sensation intact and symmetric. Cerebellar reflexes intact.   Skin: warm, dry, intact. No rash/ulcer. No concerning nevi or subq nodules on limited exam.    Psychiatric: Normal judgment/insight. Normal mood and affect. Oriented x3.    Results for orders placed or performed in visit on 04/17/19 (from the past 72 hour(s))  CBC     Status: None   Collection Time: 04/17/19  2:08 PM  Result Value Ref Range   WBC 7.1 3.8 - 10.8 Thousand/uL   RBC 4.95 4.20 - 5.80 Million/uL   Hemoglobin 15.6 13.2 - 17.1 g/dL   HCT 43.9 38.5 - 50.0 %   MCV 88.7 80.0 - 100.0 fL   MCH 31.5 27.0 - 33.0 pg   MCHC 35.5 32.0 - 36.0 g/dL   RDW 11.9 11.0 - 15.0 %   Platelets 228 140 - 400 Thousand/uL   MPV 11.7 7.5 - 12.5 fL  COMPLETE METABOLIC PANEL WITH GFR     Status: Abnormal   Collection Time: 04/17/19  2:08 PM  Result Value Ref Range   Glucose, Bld 94 65 - 99 mg/dL    Comment: .            Fasting reference interval .    BUN 23 7 - 25 mg/dL   Creat 1.19 0.60 - 1.35 mg/dL   GFR, Est Non African American 77 > OR = 60 mL/min/1.80m2   GFR, Est African American 89 > OR = 60 mL/min/1.24m2   BUN/Creatinine Ratio NOT APPLICABLE 6 - 22 (calc)   Sodium 139 135 - 146 mmol/L   Potassium 4.2 3.5 - 5.3 mmol/L   Chloride 105 98 - 110 mmol/L   CO2 28 20 - 32 mmol/L   Calcium 9.3 8.6 - 10.3 mg/dL   Total Protein 7.1 6.1 - 8.1 g/dL   Albumin 4.4 3.6 - 5.1 g/dL   Globulin 2.7 1.9 - 3.7 g/dL (calc)   AG Ratio 1.6 1.0 - 2.5 (calc)   Total Bilirubin 1.3 (H) 0.2 - 1.2 mg/dL   Alkaline phosphatase (APISO) 66 36 - 130 U/L   AST 18 10 - 40 U/L   ALT 17 9 - 46 U/L   Lipid panel     Status: None   Collection Time: 04/17/19  2:08 PM  Result Value Ref Range   Cholesterol 154 <200 mg/dL   HDL 50 > OR = 40 mg/dL   Triglycerides 119 <150 mg/dL   LDL Cholesterol (Calc) 82 mg/dL (calc)    Comment: Reference range: <100 . Desirable range <100 mg/dL for primary prevention;   <70 mg/dL for patients with CHD or diabetic patients  with > or = 2 CHD risk factors. Marland Kitchen LDL-C is now calculated using the Martin-Hopkins  calculation, which is a validated novel method providing  better accuracy than the Friedewald equation in the  estimation of LDL-C.  Cresenciano Genre et al. Annamaria Helling. 0272;536(64): 2061-2068  (http://education.[facility].com/faq/FAQ164)    Total CHOL/HDL Ratio 3.1 <5.0 (calc)   Non-HDL Cholesterol (Calc) 104 <130 mg/dL (calc)    Comment: For patients with diabetes plus 1 major ASCVD risk  factor, treating to a non-HDL-C goal of <100 mg/dL  (LDL-C of <70 mg/dL) is considered a therapeutic  option.   TSH     Status: None   Collection Time: 04/17/19  2:08 PM  Result Value Ref Range   TSH 1.44 0.40 - 4.50 mIU/L           ASSESSMENT/PLAN: The primary encounter diagnosis was Annual physical exam. Diagnoses of Need for Tdap vaccination, Gilbert syndrome, and History of thyroid disorder were also pertinent to this visit.   Orders Placed This Encounter  Procedures  . Tdap vaccine greater than or equal to 7yo IM  . CBC  . COMPLETE METABOLIC PANEL WITH GFR  . Lipid panel  . TSH    Meds ordered this encounter  Medications  . sildenafil (REVATIO) 20 MG tablet    Sig: Take 1-5 tablets (20-100 mg total) by mouth daily as needed (prior to sex. Use lowest effective dose).    Dispense:  90 tablet    Refill:  1  . SUMAtriptan (IMITREX) 100 MG tablet    Sig: Take 0.5-1 tablets (50-100 mg total) by mouth once for 1 dose. May repeat in 2 hours if headache persists or recurs.    Dispense:  20 tablet    Refill:  1    Patient Instructions   General Preventive Care  Most recent routine screening labs: ordered today   Everyone should have blood pressure checked once per year.   Tobacco: don't!   Alcohol: responsible moderation is ok for most adults - if you have concerns about your alcohol intake, please talk to me!   Exercise: as tolerated to reduce risk of cardiovascular disease and diabetes. Strength training will also prevent osteoporosis.   Mental health: if need for mental health care (medicines, counseling, other), or concerns about moods, please let me know!   Sexual health: if need for STD testing, or if concerns with libido/pain problems, please let me know!   Advanced Directive: Living Will and/or Healthcare Power of Attorney recommended for all adults, regardless of age or health.  Vaccines  Flu vaccine: recommended for almost everyone, every fall.   Shingles vaccine: Shingrix recommended after age 8.   Pneumonia vaccines: Prevnar and Pneumovax recommended after age 42.   Tetanus booster: Tdap recommended every 10 years.  Cancer screenings   Colon cancer screening: recommended for everyone at age 74, but some folks need a colonoscopy sooner if risk factors   Prostate cancer screening: PSA blood test around age 8  Lung cancer screening: not needed for non-smokers  Infection screenings . HIV: recommended screening at least once age 25-65, more often as needed. . Gonorrhea/Chlamydia: screening as needed. . Hepatitis C: recommended for anyone born 73-1966 . TB: certain at-risk populations, or depending on work requirements and/or travel history Other . Bone Density Test: recommended for men at age 66 . Abdominal Aortic Aneurysm: screening with ultrasound recommended once for men age 41-75 who have ever smoked         Visit summary with medication list and pertinent instructions was printed for patient to review. All questions at time of visit  were answered - patient instructed to contact  office with any additional concerns or updates. ER/RTC precautions were reviewed with the patient.     Please note: voice recognition software was used to produce this document, and typos may escape review. Please contact Dr. Sheppard Coil for any needed clarifications.     Follow-up plan: Return in about 1 year (around 04/16/2020) for [place] (call week prior to visit for lab orders).

## 2019-04-18 ENCOUNTER — Encounter: Payer: Self-pay | Admitting: Osteopathic Medicine

## 2019-04-18 LAB — COMPLETE METABOLIC PANEL WITH GFR
AG Ratio: 1.6 (calc) (ref 1.0–2.5)
ALT: 17 U/L (ref 9–46)
AST: 18 U/L (ref 10–40)
Albumin: 4.4 g/dL (ref 3.6–5.1)
Alkaline phosphatase (APISO): 66 U/L (ref 36–130)
BUN: 23 mg/dL (ref 7–25)
CO2: 28 mmol/L (ref 20–32)
Calcium: 9.3 mg/dL (ref 8.6–10.3)
Chloride: 105 mmol/L (ref 98–110)
Creat: 1.19 mg/dL (ref 0.60–1.35)
GFR, Est African American: 89 mL/min/{1.73_m2} (ref >=60)
GFR, Est Non African American: 77 mL/min/{1.73_m2} (ref >=60)
Globulin: 2.7 g/dL (calc) (ref 1.9–3.7)
Glucose, Bld: 94 mg/dL (ref 65–99)
Potassium: 4.2 mmol/L (ref 3.5–5.3)
Sodium: 139 mmol/L (ref 135–146)
Total Bilirubin: 1.3 mg/dL — ABNORMAL HIGH (ref 0.2–1.2)
Total Protein: 7.1 g/dL (ref 6.1–8.1)

## 2019-04-18 LAB — CBC
HCT: 43.9 % (ref 38.5–50.0)
Hemoglobin: 15.6 g/dL (ref 13.2–17.1)
MCH: 31.5 pg (ref 27.0–33.0)
MCHC: 35.5 g/dL (ref 32.0–36.0)
MCV: 88.7 fL (ref 80.0–100.0)
MPV: 11.7 fL (ref 7.5–12.5)
Platelets: 228 10*3/uL (ref 140–400)
RBC: 4.95 10*6/uL (ref 4.20–5.80)
RDW: 11.9 % (ref 11.0–15.0)
WBC: 7.1 10*3/uL (ref 3.8–10.8)

## 2019-04-18 LAB — LIPID PANEL
Cholesterol: 154 mg/dL (ref <200)
HDL: 50 mg/dL (ref >=40)
LDL Cholesterol (Calc): 82 mg/dL (calc)
Non-HDL Cholesterol (Calc): 104 mg/dL (calc) (ref <130)
Total CHOL/HDL Ratio: 3.1 (calc) (ref <5.0)
Triglycerides: 119 mg/dL (ref <150)

## 2019-04-18 LAB — TSH: TSH: 1.44 mIU/L (ref 0.40–4.50)

## 2019-04-23 ENCOUNTER — Encounter: Payer: Self-pay | Admitting: Gastroenterology

## 2019-04-26 ENCOUNTER — Encounter: Payer: Self-pay | Admitting: Osteopathic Medicine

## 2019-04-26 DIAGNOSIS — G43809 Other migraine, not intractable, without status migrainosus: Secondary | ICD-10-CM

## 2019-04-26 DIAGNOSIS — Z8279 Family history of other congenital malformations, deformations and chromosomal abnormalities: Secondary | ICD-10-CM

## 2019-05-11 ENCOUNTER — Ambulatory Visit (INDEPENDENT_AMBULATORY_CARE_PROVIDER_SITE_OTHER): Payer: Managed Care, Other (non HMO) | Admitting: Gastroenterology

## 2019-05-11 ENCOUNTER — Other Ambulatory Visit: Payer: Self-pay

## 2019-05-11 ENCOUNTER — Encounter: Payer: Self-pay | Admitting: Gastroenterology

## 2019-05-11 VITALS — BP 120/82 | HR 62 | Temp 97.7°F | Wt 218.0 lb

## 2019-05-11 DIAGNOSIS — R17 Unspecified jaundice: Secondary | ICD-10-CM

## 2019-05-11 DIAGNOSIS — Z8371 Family history of colonic polyps: Secondary | ICD-10-CM

## 2019-05-11 DIAGNOSIS — K649 Unspecified hemorrhoids: Secondary | ICD-10-CM | POA: Diagnosis not present

## 2019-05-11 DIAGNOSIS — K921 Melena: Secondary | ICD-10-CM

## 2019-05-11 DIAGNOSIS — Z800 Family history of malignant neoplasm of digestive organs: Secondary | ICD-10-CM

## 2019-05-11 MED ORDER — NA SULFATE-K SULFATE-MG SULF 17.5-3.13-1.6 GM/177ML PO SOLN: 1.0000 | Freq: Once | ORAL | 0 refills | Status: AC

## 2019-05-11 NOTE — Progress Notes (Signed)
Chief Complaint: Family history of colon cancer and colon polyps, hematochezia, hemorrhoids  Referring Provider:     Emeterio Reeve, DO  HPI:    Ricky Marshall is a 38 y.o. male referred to the [facility] for evaluation of early CRC screening due to strong family history of colon cancer and colon polyps.  Family history outlined below, with CRC on both maternal and paternal sides, both mother and father with colon polyps, mother requiring surgical resection at some point.  Additionally, he has a hx of intermittent bleeding.  He reports a history of hemorrhoids, with exacerbation a few years ago and c/b infection/abscess requiring I&D. No recurrence of abscess, but continues to have intermittent BRBPR in toilet water and tissue paper, increasing recently. Stools tend to be loose.    Family history: -Father- colon polyps -Mother- Colon polyps; required surgical resection -Maternal aunts colon polyps -Maternal aunt colon cancer 82's -Paternal uncle colon cancer, died in his 33s  Recent normal CMP (T bili 1.3, stable from 1 year ago), and CBC  Past Medical History:  Diagnosis Date  . 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 Thyroid disease    previous abn labs, TSH and T4 ok in 2018     Past Surgical History:  Procedure Laterality Date  . KNEE SURGERY Right 2003, 2013   Family History  Problem Relation Age of Onset  . Hypertension Father   . Colon polyps Father   . Hypertension Brother   . Thyroid disease Maternal Aunt   . Colon cancer Maternal Aunt   . Thyroid disease Maternal Grandmother   . Stroke Maternal Grandmother   . Diabetes Paternal Grandfather   . Cancer Paternal Grandfather   . Colon polyps Mother   . Colon cancer Paternal Uncle    Social History   Tobacco Use  . Smoking status: Never Smoker  . Smokeless tobacco: Never Used  Substance Use Topics  . Alcohol use: Yes    Comment: few beers per week   . Drug use: Never   Current  Outpatient Medications  Medication Sig Dispense Refill  . sildenafil (REVATIO) 20 MG tablet Take 1-5 tablets (20-100 mg total) by mouth daily as needed (prior to sex. Use lowest effective dose). 90 tablet 1  . SUMAtriptan (IMITREX) 100 MG tablet Take 0.5-1 tablets (50-100 mg total) by mouth once for 1 dose. May repeat in 2 hours if headache persists or recurs. 20 tablet 1   No current facility-administered medications for this visit.    Allergies  Allergen Reactions  . Erythromycin Hives     Review of Systems: All systems reviewed and negative except where noted in HPI.     Physical Exam:    Wt Readings from Last 3 Encounters:  05/11/19 218 lb (98.9 kg)  04/17/19 217 lb 12.8 oz (98.8 kg)  07/12/17 232 lb (105.2 kg)    BP 120/82   Pulse 62   Temp 97.7 F (36.5 C)   Wt 218 lb (98.9 kg)   BMI 29.57 kg/m  Constitutional:  Pleasant, in no acute distress. Psychiatric: Normal mood and affect. Behavior is normal. EENT: Pupils normal.  Conjunctivae are normal. No scleral icterus. Neck supple. No cervical LAD. Cardiovascular: Normal rate, regular rhythm. No edema Pulmonary/chest: Effort normal and breath sounds normal. No wheezing, rales or rhonchi. Abdominal: Soft, nondistended, nontender. Bowel sounds active throughout. There are no masses palpable. No hepatomegaly. Neurological: Alert and oriented to person place  and time. Skin: Skin is warm and dry. No rashes noted. Rectal: Exam deferred by patient to time of colonoscopy.    ASSESSMENT AND PLAN;   1) Family history of colon cancer 2) Family history of colon polyps 3) Hematochezia 4) History of hemorrhoids  - Has multiple indicators for colonoscopy, to include active lower GI symptoms and strong family history of colorectal disease, to include mother with colon polyps and prior colon surgery, father with colon polyps, and early CRC on both sides of the family - Discussed hemorrhoid band ligation pending colonoscopy  findings - Schedule colonoscopy   5) Elevated T bili: -T bili 1.3 this month, and 1.3 in 05/2017.  Normal ALP, AST, ALT.  Suspect Gilbert's - Obtain hepatic panel with Dbili for confirmation on next labs  The indications, risks, and benefits of colonoscopy were explained to the patient in detail. Risks include but are not limited to bleeding, perforation, adverse reaction to medications, and cardiopulmonary compromise. Sequelae include but are not limited to the possibility of surgery, hospitalization, and mortality. The patient verbalized understanding and wished to proceed. All questions answered, referred to the scheduler and bowel prep ordered. Further recommendations pending results of the exam.    Lavena Bullion, DO, FACG  05/11/2019, 2:41 PM   Emeterio Reeve, DO

## 2019-05-11 NOTE — Patient Instructions (Signed)
You have been scheduled for a colonoscopy. Please follow written instructions given to you at your visit today.  Please pick up your prep supplies at the pharmacy within the next 1-3 days. If you use inhalers (even only as needed), please bring them with you on the day of your procedure.   

## 2019-05-24 ENCOUNTER — Encounter: Payer: Self-pay | Admitting: Osteopathic Medicine

## 2019-05-24 ENCOUNTER — Encounter: Payer: Self-pay | Admitting: Gastroenterology

## 2019-05-31 ENCOUNTER — Ambulatory Visit (HOSPITAL_COMMUNITY)
Admission: RE | Admit: 2019-05-31 | Discharge: 2019-05-31 | Disposition: A | Discharge status: Home / Self Care | Payer: Managed Care, Other (non HMO) | Source: Ambulatory Visit | Attending: Osteopathic Medicine | Admitting: Osteopathic Medicine

## 2019-05-31 ENCOUNTER — Other Ambulatory Visit: Payer: Self-pay

## 2019-05-31 DIAGNOSIS — G43809 Other migraine, not intractable, without status migrainosus: Secondary | ICD-10-CM | POA: Diagnosis not present

## 2019-05-31 DIAGNOSIS — Z8279 Family history of other congenital malformations, deformations and chromosomal abnormalities: Secondary | ICD-10-CM

## 2019-05-31 DIAGNOSIS — Q211 Atrial septal defect: Secondary | ICD-10-CM | POA: Insufficient documentation

## 2019-05-31 NOTE — Progress Notes (Signed)
  Echocardiogram 2D Echocardiogram has been performed.  Ricky Marshall 05/31/2019, 12:27 PM

## 2019-06-01 ENCOUNTER — Encounter: Payer: Self-pay | Admitting: Family Medicine

## 2019-06-01 DIAGNOSIS — Q2112 Patent foramen ovale: Secondary | ICD-10-CM

## 2019-06-01 DIAGNOSIS — Q211 Atrial septal defect: Secondary | ICD-10-CM

## 2019-06-01 NOTE — Telephone Encounter (Signed)
I don't see an [place].  What type of referral is needed?

## 2019-06-01 NOTE — Telephone Encounter (Signed)
refrral placed

## 2019-06-01 NOTE — Telephone Encounter (Signed)
Cardiology referral based on echo

## 2019-06-07 ENCOUNTER — Encounter: Payer: Managed Care, Other (non HMO) | Admitting: Gastroenterology

## 2019-06-08 ENCOUNTER — Other Ambulatory Visit: Payer: Self-pay

## 2019-06-08 ENCOUNTER — Encounter: Payer: Self-pay | Admitting: Cardiology

## 2019-06-08 ENCOUNTER — Ambulatory Visit (INDEPENDENT_AMBULATORY_CARE_PROVIDER_SITE_OTHER): Payer: Managed Care, Other (non HMO) | Admitting: Cardiology

## 2019-06-08 DIAGNOSIS — Z8279 Family history of other congenital malformations, deformations and chromosomal abnormalities: Secondary | ICD-10-CM | POA: Diagnosis not present

## 2019-06-08 NOTE — Progress Notes (Signed)
Cardiology Office Note:    Date:  06/08/2019   ID:  Ricky Marshall, DOB 1981/01/21, MRN [id number]  PCP:  Emeterio Reeve, DO  Cardiologist:  Jenean Lindau, MD   Referring MD: Emeterio Reeve, DO    ASSESSMENT:    1. Family history of patent foramen ovale    PLAN:    In order of problems listed above:  1. Patent foramen ovale with shunting: I discussed my findings with the patient at extensive length.  The physiology of patent foramen ovale was also discussed at length.  The patient is keen on evaluation for closure and I will refer him to my partner Dr. Burt Knack for further evaluation.  I mentioned to the patient that the benefits and risks of this procedure as any other procedure.  I also mentioned to him that closure of the forearm and does not guarantee resolution of his migraine headaches which are occurring frequently.  He understands.  Further recommendations will be made by our expert colleagues.  Will be seen in follow-up appointment after the above.   Medication Adjustments/Labs and Tests Ordered: Current medicines are reviewed at length with the patient today.  Concerns regarding medicines are outlined above.  No orders of the defined types were placed in this encounter.  No orders of the defined types were placed in this encounter.    History of Present Illness:    Ricky Marshall is a 38 y.o. male who is being seen today for the evaluation of patent foramen ovale at the request of Emeterio Reeve, DO.  Patient is a pleasant 38 year old male.  He has no significant past medical history.  He mentions to me that he was getting migraines recurrently and saw his primary care doctor.  He mentioned to them that his family members on his mother side also used to have migraines and then they were evaluated and found to have patent foramen ovale and 2 of them underwent closure with recovery and resolution of migraines.  He mentions to me that his migraines have become more  frequent probably once a month and he feels that he is patent foramina ovale closure may have some benefit.  He understands that there is no guarantee of such a thing happening.  Incidentally his echocardiogram revealed a significant sized para patent foramen ovale.  Details are mentioned below.  At the time of my evaluation, the patient is alert awake oriented and in no distress.  Patient is overall active and healthy gentleman.  Past Medical History:  Diagnosis Date   Rosanna Randy syndrome 05/20/2017   Thyroid disease    previous abn labs, TSH and T4 ok in 2018, inactive    Past Surgical History:  Procedure Laterality Date   KNEE SURGERY Right 2003, 2013    Current Medications: Current Meds  Medication Sig   sildenafil (REVATIO) 20 MG tablet Take 1-5 tablets (20-100 mg total) by mouth daily as needed (prior to sex. Use lowest effective dose).   SUMAtriptan (IMITREX) 100 MG tablet Take 0.5-1 tablets (50-100 mg total) by mouth once for 1 dose. May repeat in 2 hours if headache persists or recurs.     Allergies:   Erythromycin   Social History   Socioeconomic History   Marital status: Married    Spouse name: Not on file   Number of children: Not on file   Years of education: Not on file   Highest education level: Not on file  Occupational History   Occupation: Buyer, retail  Comment: wound dressings, sterilizing equipment   Social Needs   Financial resource strain: Not on file   Food insecurity    Worry: Not on file    Inability: Not on file   Transportation needs    Medical: Not on file    Non-medical: Not on file  Tobacco Use   Smoking status: Never Smoker   Smokeless tobacco: Never Used  Substance and Sexual Activity   Alcohol use: Yes    Comment: few beers per week    Drug use: Never   Sexual activity: Not on file  Lifestyle   Physical activity    Days per week: Not on file    Minutes per session: Not on file   Stress: Not on file    Relationships   Social connections    Talks on phone: Not on file    Gets together: Not on file    Attends religious service: Not on file    Active member of club or organization: Not on file    Attends meetings of clubs or organizations: Not on file    Relationship status: Not on file  Other Topics Concern   Not on file  Social History Narrative   Not on file     Family History: The patient's family history includes Cancer in his paternal grandfather; Colon cancer in his maternal aunt and paternal uncle; Colon polyps in his father and mother; Diabetes in his paternal grandfather; Hypertension in his brother and father; Stroke in his maternal grandmother; Thyroid disease in his maternal aunt and maternal grandmother.  ROS:   Please see the history of present illness.    All other systems reviewed and are negative.  EKGs/Labs/Other Studies Reviewed:    The following studies were reviewed today:   EKG reveals sinus rhythm and nonspecific ST-T changes.     IMPRESSIONS    1. Left ventricular ejection fraction, by visual estimation, is 55 to 60%. The left ventricle has normal function. Normal left ventricular size. There is no left ventricular hypertrophy.  2. Global right ventricle has normal systolic function.The right ventricular size is normal. No increase in right ventricular wall thickness.  3. Left atrial size was normal.  4. Right atrial size was normal.  5. The mitral valve is normal in structure. No evidence of mitral valve regurgitation. No evidence of mitral stenosis.  6. The tricuspid valve is normal in structure. Tricuspid valve regurgitation was not visualized by color flow Doppler.  7. The aortic valve is normal in structure. Aortic valve regurgitation was not visualized by color flow Doppler. Structurally normal aortic valve, with no evidence of sclerosis or stenosis.  8. The pulmonic valve was grossly normal. Pulmonic valve regurgitation is not visualized by  color flow Doppler.  9. The inferior vena cava is dilated in size with >50% respiratory variability, suggesting right atrial pressure of 8 mmHg. 10. Moderately sized patent foramen ovale with predominantly right to left shunting across the atrial septum. 11. Evidence of atrial level shunting detected by color flow Doppler.    Recent Labs: 04/17/2019: ALT 17; BUN 23; Creat 1.19; Hemoglobin 15.6; Platelets 228; Potassium 4.2; Sodium 139; TSH 1.44  Recent Lipid Panel    Component Value Date/Time   CHOL 154 04/17/2019 1408   TRIG 119 04/17/2019 1408   HDL 50 04/17/2019 1408   CHOLHDL 3.1 04/17/2019 1408   LDLCALC 82 04/17/2019 1408    Physical Exam:    VS:  BP 120/78 (BP Location: Right Arm, Patient  Position: Sitting, Cuff Size: Normal)    Pulse (!) 59    Ht 6' (1.829 m)    Wt 219 lb (99.3 kg)    SpO2 95%    BMI 29.70 kg/m     Wt Readings from Last 3 Encounters:  06/08/19 219 lb (99.3 kg)  05/11/19 218 lb (98.9 kg)  04/17/19 217 lb 12.8 oz (98.8 kg)     GEN: Patient is in no acute distress HEENT: Normal NECK: No JVD; No carotid bruits LYMPHATICS: No lymphadenopathy CARDIAC: S1 S2 regular, 2/6 systolic murmur at the apex. RESPIRATORY:  Clear to auscultation without rales, wheezing or rhonchi  ABDOMEN: Soft, non-tender, non-distended MUSCULOSKELETAL:  No edema; No deformity  SKIN: Warm and dry NEUROLOGIC:  Alert and oriented x 3 PSYCHIATRIC:  Normal affect    Signed, Jenean Lindau, MD  06/08/2019 4:20 PM    [facility]

## 2019-06-08 NOTE — Patient Instructions (Signed)
Medication Instructions:  Your physician recommends that you continue on your current medications as directed. Please refer to the Current Medication list given to you today.  If you need a refill on your cardiac medications before your next appointment, please call your pharmacy.   Lab work: NONE If you have labs (blood work) drawn today and your tests are completely normal, you will receive your results only by: Marland Kitchen MyChart Message (if you have MyChart) OR . A paper copy in the mail If you have any lab test that is abnormal or we need to change your treatment, we will call you to review the results.  Testing/Procedures: You had an EKG Performed today  YOU have been referred to Dr. Sherren Mocha in [place], you will receive a call to schedule appt  Follow-Up: At [facility], you and your health needs are our priority.  As part of our continuing mission to provide you with exceptional heart care, we have created designated Provider Care Teams.  These Care Teams include your primary Cardiologist (physician) and Advanced Practice Providers (APPs -  Physician Assistants and Nurse Practitioners) who all work together to provide you with the care you need, when you need it. You will need a follow up appointment in 6 months.

## 2019-06-14 ENCOUNTER — Telehealth: Payer: Self-pay

## 2019-06-14 NOTE — Telephone Encounter (Signed)
Called to arrange PFO consult with Dr. Burt Knack per Dr. Geraldo Pitter.  Left message to call back.

## 2019-06-14 NOTE — Telephone Encounter (Signed)
Scheduled the patient 10/26 for PFO consult with Dr. Burt Knack. He was grateful for call and agrees with treatment plan.

## 2019-07-09 ENCOUNTER — Ambulatory Visit: Payer: Managed Care, Other (non HMO) | Admitting: Cardiology

## 2019-07-09 ENCOUNTER — Ambulatory Visit (INDEPENDENT_AMBULATORY_CARE_PROVIDER_SITE_OTHER): Payer: Managed Care, Other (non HMO) | Admitting: Cardiovascular Disease

## 2019-07-09 ENCOUNTER — Encounter: Payer: Self-pay | Admitting: Cardiovascular Disease

## 2019-07-09 VITALS — BP 118/70 | HR 46 | Ht 72.0 in | Wt 222.4 lb

## 2019-07-09 DIAGNOSIS — Q211 Atrial septal defect: Secondary | ICD-10-CM | POA: Diagnosis not present

## 2019-07-09 DIAGNOSIS — Q2112 Patent foramen ovale: Secondary | ICD-10-CM

## 2019-07-09 NOTE — Progress Notes (Signed)
[facility]   STRUCTURAL HEART TEAM  Date:  07/09/2019   ID:  Ricky Marshall, DOB [birth year], MRN [id number]  PCP:  Emeterio Reeve, DO   CC: Migraine and PFO   HISTORY OF PRESENT ILLNESS: Ricky Marshall is a 38 y.o. male who presents for evaluation of PFO, referred by Dr Geraldo Pitter for evaluation of PFO management.  The patient has a personal history and family history of migraine headaches. He has no hx of stroke or TIA. His first migraine headache occurred in his early 20's. There is a relationship between physical activity and migraine frequency, when he exercises more there is an increase in his migraine frequency. He describes headache with aura, followed by a feeling a pressure in the forehead the following day. If he doesn't take imitrex, he has nausea, vomiting, and photophobia associated with the headache. He has migraine headaches approximately once/month. He is otherwise very healthy with no medical problems. Today, he denies symptoms of palpitations, chest pain, shortness of breath, orthopnea, PND, lower extremity edema, dizziness, or syncope.  The patient denies any history of nickel allergy or reaction.  Past Medical History:  Diagnosis Date  . 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 Thyroid disease    previous abn labs, TSH and T4 ok in 2018, inactive    Current Outpatient Medications  Medication Sig Dispense Refill  . SUMAtriptan (IMITREX) 100 MG tablet Take 0.5-1 tablets (50-100 mg total) by mouth once for 1 dose. May repeat in 2 hours if headache persists or recurs. 20 tablet 1   No current facility-administered medications for this visit.     ALLERGIES:   Erythromycin   SOCIAL HISTORY:  The patient  reports that he has never smoked. He has never used smokeless tobacco. He reports current alcohol use. He reports that he does not use drugs.   FAMILY HISTORY:  The patient's family history includes Cancer in his paternal grandfather; Colon cancer in his maternal  aunt and paternal uncle; Colon polyps in his father and mother; Diabetes in his paternal grandfather; Hypertension in his brother and father; Stroke in his maternal grandmother; Thyroid disease in his maternal aunt and maternal grandmother.   REVIEW OF SYSTEMS:  All other systems are reviewed and negative.   PHYSICAL EXAM: VS:  BP 118/70   Pulse (!) 46   Ht 6' (1.829 m)   Wt 222 lb 6.4 oz (100.9 kg)   BMI 30.16 kg/m  , BMI Body mass index is 30.16 kg/m. GEN: Well nourished, well developed, in no acute distress HEENT: normal Neck: No JVD. carotids 2+ without bruits or masses Cardiac: The heart is RRR without murmurs, rubs, or gallops. No edema. Pedal pulses 2+ = bilaterally  Respiratory:  clear to auscultation bilaterally GI: soft, nontender, nondistended, + BS MS: no deformity or atrophy Skin: warm and dry, no rash Neuro:  Strength and sensation are intact Psych: euthymic mood, full affect  EKG:  EKG from today reviewed and demonstrates sinus bradycardia 46 bpm, otherwise normal  RECENT LABS: 04/17/2019: ALT 17; BUN 23; Creat 1.19; Hemoglobin 15.6; Platelets 228; Potassium 4.2; Sodium 139; TSH 1.44  04/17/2019: Cholesterol 154; HDL 50; LDL Cholesterol (Calc) 82; Total CHOL/HDL Ratio 3.1; Triglycerides 119   CrCl cannot be calculated (Patient's most recent lab result is older than the maximum 21 days allowed.).   Wt Readings from Last 3 Encounters:  07/09/19 222 lb 6.4 oz (100.9 kg)  06/08/19 219 lb (99.3 kg)  05/11/19 218 lb (98.9 kg)  PERTINENT STUDIES:  Echo:  IMPRESSIONS    1. Left ventricular ejection fraction, by visual estimation, is 55 to 60%. The left ventricle has normal function. Normal left ventricular size. There is no left ventricular hypertrophy.  2. Global right ventricle has normal systolic function.The right ventricular size is normal. No increase in right ventricular wall thickness.  3. Left atrial size was normal.  4. Right atrial size was normal.   5. The mitral valve is normal in structure. No evidence of mitral valve regurgitation. No evidence of mitral stenosis.  6. The tricuspid valve is normal in structure. Tricuspid valve regurgitation was not visualized by color flow Doppler.  7. The aortic valve is normal in structure. Aortic valve regurgitation was not visualized by color flow Doppler. Structurally normal aortic valve, with no evidence of sclerosis or stenosis.  8. The pulmonic valve was grossly normal. Pulmonic valve regurgitation is not visualized by color flow Doppler.  9. The inferior vena cava is dilated in size with >50% respiratory variability, suggesting right atrial pressure of 8 mmHg. 10. Moderately sized patent foramen ovale with predominantly right to left shunting across the atrial septum. 11. Evidence of atrial level shunting detected by color flow Doppler.  ASSESSMENT AND PLAN: PFO with Migraine headache with aura: the patient has severe migraines, managed medically. We reviewed PFO migraine clinical trials. He understands there is anecdotal evidence of migraine improvement in patients who undergo PFO closure, but there has not been RCT data to support PFO closure for migraine treatment. I demonstrated PFO closure devices to the patient today and discussed procedural risks and potential benefits. I advised him that I would reach out to colleagues and to industry to see if there are any migraine trials that are currently enrolling. I also encouraged him to seek neurology consultation to consider brain imaging. Certainly if there any evidence of prior embolic or ischemic events on brain MRI, it would be reasonable to consider transcatheter PFO closure.   The patient's echo images are reviewed and there is clearly right-to-left shunting with positive bubble study. I do not see any clear evidence of color flow across the interatrial septum. This is typical of a PFO and if he ultimately requires closure he will need to have a TEE  done.   Sherren Mocha 07/09/2019 8:25 AM

## 2019-07-09 NOTE — Patient Instructions (Addendum)
Your provider recommends that you schedule a follow-up appointment AS NEEDED. 

## 2019-07-11 ENCOUNTER — Telehealth: Payer: Self-pay

## 2019-07-11 NOTE — Telephone Encounter (Signed)
Covid-19 screening questions   Do you now or have you had a fever in the last 14 days?  Do you have any respiratory symptoms of shortness of breath or cough now or in the last 14 days?  Do you have any family members or close contacts with diagnosed or suspected Covid-19 in the past 14 days?  Have you been tested for Covid-19 and found to be positive?       

## 2019-07-11 NOTE — Telephone Encounter (Signed)
Patient called back and answered "NO" to all screening questions. °

## 2019-07-12 ENCOUNTER — Other Ambulatory Visit: Payer: Self-pay | Admitting: Gastroenterology

## 2019-07-12 ENCOUNTER — Other Ambulatory Visit: Payer: Self-pay

## 2019-07-12 ENCOUNTER — Ambulatory Visit (AMBULATORY_SURGERY_CENTER): Payer: Managed Care, Other (non HMO) | Admitting: Gastroenterology

## 2019-07-12 ENCOUNTER — Encounter: Payer: Self-pay | Admitting: Gastroenterology

## 2019-07-12 VITALS — BP 105/65 | HR 55 | Temp 98.7°F | Resp 15 | Ht 73.0 in | Wt 218.0 lb

## 2019-07-12 DIAGNOSIS — Z800 Family history of malignant neoplasm of digestive organs: Secondary | ICD-10-CM

## 2019-07-12 DIAGNOSIS — K641 Second degree hemorrhoids: Secondary | ICD-10-CM

## 2019-07-12 DIAGNOSIS — D125 Benign neoplasm of sigmoid colon: Secondary | ICD-10-CM | POA: Diagnosis not present

## 2019-07-12 DIAGNOSIS — D124 Benign neoplasm of descending colon: Secondary | ICD-10-CM

## 2019-07-12 DIAGNOSIS — K621 Rectal polyp: Secondary | ICD-10-CM

## 2019-07-12 DIAGNOSIS — D120 Benign neoplasm of cecum: Secondary | ICD-10-CM | POA: Diagnosis not present

## 2019-07-12 MED ORDER — SODIUM CHLORIDE 0.9 % IV SOLN: 500.0000 mL | INTRAVENOUS | Status: DC

## 2019-07-12 NOTE — Progress Notes (Signed)
Report given to PACU, vss 

## 2019-07-12 NOTE — Patient Instructions (Signed)
Please see handouts given to you on Hemorrhoids, Banding and Polyps. Use Fiber such as Fibercon, Citrucel or Metamucil.    YOU HAD AN ENDOSCOPIC PROCEDURE TODAY AT [facility]:   Refer to the procedure report that was given to you for any specific questions about what was found during the examination.  If the procedure report does not answer your questions, please call your gastroenterologist to clarify.  If you requested that your care partner not be given the details of your procedure findings, then the procedure report has been included in a sealed envelope for you to review at your convenience later.  YOU SHOULD EXPECT: Some feelings of bloating in the abdomen. Passage of more gas than usual.  Walking can help get rid of the air that was put into your GI tract during the procedure and reduce the bloating. If you had a lower endoscopy (such as a colonoscopy or flexible sigmoidoscopy) you may notice spotting of blood in your stool or on the toilet paper. If you underwent a bowel prep for your procedure, you may not have a normal bowel movement for a few days.  Please Note:  You might notice some irritation and congestion in your nose or some drainage.  This is from the oxygen used during your procedure.  There is no need for concern and it should clear up in a day or so.  SYMPTOMS TO REPORT IMMEDIATELY:   Following lower endoscopy (colonoscopy or flexible sigmoidoscopy):  Excessive amounts of blood in the stool  Significant tenderness or worsening of abdominal pains  Swelling of the abdomen that is new, acute  Fever of 100F or higher   For urgent or emergent issues, a gastroenterologist can be reached at any hour by calling [phone].   DIET:  We do recommend a small meal at first, but then you may proceed to your regular diet.  Drink plenty of fluids but you should avoid alcoholic beverages for 24 hours.  ACTIVITY:  You should plan to take it easy for the rest of  today and you should NOT DRIVE or use heavy machinery until tomorrow (because of the sedation medicines used during the test).    FOLLOW UP: Our staff will call the number listed on your records 48-72 hours following your procedure to check on you and address any questions or concerns that you may have regarding the information given to you following your procedure. If we do not reach you, we will leave a message.  We will attempt to reach you two times.  During this call, we will ask if you have developed any symptoms of COVID 19. If you develop any symptoms (ie: fever, flu-like symptoms, shortness of breath, cough etc.) before then, please call [phone].  If you test positive for Covid 19 in the 2 weeks post procedure, please call and report this information to [place].    If any biopsies were taken you will be contacted by phone or by letter within the next 1-3 weeks.  Please call us at [phone] if you have not heard about the biopsies in 3 weeks.    SIGNATURES/CONFIDENTIALITY: You and/or your care partner have signed paperwork which will be entered into your electronic medical record.  These signatures attest to the fact that that the information above on your After Visit Summary has been reviewed and is understood.  Full responsibility of the confidentiality of this discharge information lies with you and/or your care-partner.

## 2019-07-12 NOTE — Progress Notes (Signed)
Called to room to assist during endoscopic procedure.  Patient ID and intended procedure confirmed with present staff. Received instructions for my participation in the procedure from the performing physician.  

## 2019-07-12 NOTE — Progress Notes (Signed)
Vitals-CW Temps-JB  Covid negative History reviewed.

## 2019-07-12 NOTE — Op Note (Signed)
[place] Patient Name: Ricky Marshall Procedure Date: 07/12/2019 11:00 AM MRN: [id number] Endoscopist: Gerrit Heck , MD Age: 38 Referring MD:  Date of Birth: 03-18-1981 Gender: Male Account #: [id number] Procedure:                Colonoscopy Indications:              Hematochezia,                           38 yo male with a family history of colon cancer                            and advanced colon polyps, presents with                            intermittent hematochezia.                           Family history:                           ?"Father- colon polyps                           ?"Mother- Colon polyps; required surgical resection                           ?"Maternal aunts colon polyps                           ?"Maternal aunt colon cancer 10's                           ?"Paternal uncle colon cancer, died in his 1s Medicines:                Monitored Anesthesia Care Procedure:                Pre-Anesthesia Assessment:                           - Prior to the procedure, a History and Physical                            was performed, and patient medications and                            allergies were reviewed. The patient's tolerance of                            previous anesthesia was also reviewed. The risks                            and benefits of the procedure and the sedation                            options and risks were discussed with the patient.  All questions were answered, and informed consent                            was obtained. Prior Anticoagulants: The patient has                            taken no previous anticoagulant or antiplatelet                            agents. ASA Grade Assessment: II - A patient with                            mild systemic disease. After reviewing the risks                            and benefits, the patient was deemed in                            satisfactory condition  to undergo the procedure.                           After obtaining informed consent, the colonoscope                            was passed under direct vision. Throughout the                            procedure, the patient's blood pressure, pulse, and                            oxygen saturations were monitored continuously. The                            Colonoscope was introduced through the anus and                            advanced to the the terminal ileum. The colonoscopy                            was performed without difficulty. The patient                            tolerated the procedure well. The quality of the                            bowel preparation was adequate. The terminal ileum,                            ileocecal valve, appendiceal orifice, and rectum                            were photographed. Scope In: 11:06:26 AM Scope Out: 11:30:46 AM Scope Withdrawal Time: 0 hours 21 minutes 6 seconds  Total Procedure Duration: 0  hours 24 minutes 20 seconds  Findings:                 Hemorrhoids were found on perianal exam.                           Six sessile polyps were found in the sigmoid colon                            (2), descending colon (3) and cecum (1). The polyps                            were 2 to 6 mm in size. These polyps were removed                            with a cold snare x4 and cold forceps x2                            (descending colon polyps). Resection and retrieval                            were complete. Estimated blood loss was minimal.                           Non-bleeding internal hemorrhoids were found during                            retroflexion. The hemorrhoids were medium-sized.                           The terminal ileum appeared normal. Complications:            No immediate complications. Estimated Blood Loss:     Estimated blood loss was minimal. Impression:               - Hemorrhoids found on perianal exam.                            - Six 2 to 6 mm polyps in the sigmoid colon, in the                            descending colon and in the cecum, removed with a                            cold snare and cold forceps. Resected and retrieved.                           - Non-bleeding internal hemorrhoids.                           - The examined portion of the ileum was normal. Recommendation:           - Patient has a contact number available for  emergencies. The signs and symptoms of potential                            delayed complications were discussed with the                            patient. Return to normal activities tomorrow.                            Written discharge instructions were provided to the                            patient.                           - Resume previous diet.                           - Continue present medications.                           - Await pathology results.                           - Repeat colonoscopy in 3 - 5 years for                            surveillance based on pathology results.                           - Return to GI clinic PRN.                           - Use fiber, for example Citrucel, Fibercon, Konsyl                            or Metamucil.                           - Internal hemorrhoids were noted on this study and                            may be amenable to hemorrhoid band ligation. If you                            are interested in further treatment of these                            hemorrhoids with band ligation, please contact my                            clinic to set up an appointment for evaluation and                            treatment. Gerrit Heck, MD 07/12/2019 11:37:26 AM

## 2019-07-16 ENCOUNTER — Telehealth: Payer: Self-pay

## 2019-07-16 NOTE — Telephone Encounter (Signed)
  Follow up Call-  Call back number 07/12/2019  Post procedure Call Back phone  # [phone]  Permission to leave phone message Yes     Patient questions:  Do you have a fever, pain , or abdominal swelling? No. Pain Score  0 *  Have you tolerated food without any problems? Yes.    Have you been able to return to your normal activities? Yes.    Do you have any questions about your discharge instructions: Diet   No. Medications  No. Follow up visit  No.  Do you have questions or concerns about your Care? No.  Actions: * If pain score is 4 or above: No action needed, pain <4.  1. Have you developed a fever since your procedure? No  2.   Have you had an respiratory symptoms (SOB or cough) since your procedure? No 3.   Have you tested positive for COVID 19 since your procedure No  4.   Have you had any family members/close contacts diagnosed with the COVID 19 since your procedure?  No  If yes to any of these questions please route to Joylene John, RN and Alphonsa Gin, RN.

## 2019-07-23 ENCOUNTER — Encounter: Payer: Self-pay | Admitting: Gastroenterology

## 2020-07-02 ENCOUNTER — Encounter: Payer: Self-pay | Admitting: Osteopathic Medicine

## 2020-07-02 ENCOUNTER — Ambulatory Visit (INDEPENDENT_AMBULATORY_CARE_PROVIDER_SITE_OTHER): Payer: Managed Care, Other (non HMO) | Admitting: Osteopathic Medicine

## 2020-07-02 VITALS — BP 138/77 | HR 59 | Temp 98.3°F | Wt 211.0 lb

## 2020-07-02 DIAGNOSIS — Z23 Encounter for immunization: Secondary | ICD-10-CM | POA: Diagnosis not present

## 2020-07-02 DIAGNOSIS — Z8639 Personal history of other endocrine, nutritional and metabolic disease: Secondary | ICD-10-CM | POA: Diagnosis not present

## 2020-07-02 DIAGNOSIS — Z0000 Encounter for general adult medical examination without abnormal findings: Secondary | ICD-10-CM | POA: Diagnosis not present

## 2020-07-02 DIAGNOSIS — Z800 Family history of malignant neoplasm of digestive organs: Secondary | ICD-10-CM | POA: Diagnosis not present

## 2020-07-02 DIAGNOSIS — R7989 Other specified abnormal findings of blood chemistry: Secondary | ICD-10-CM

## 2020-07-02 NOTE — Patient Instructions (Addendum)
General Preventive Care  Most recent routine screening labs: ordered today.   Blood pressure goal 130/80 or less.   Tobacco: don't!   Alcohol: responsible moderation is ok for most adults - if you have concerns about your alcohol intake, please talk to me!   Exercise: as tolerated to reduce risk of cardiovascular disease and diabetes. Strength training will also prevent osteoporosis.   Mental health: if need for mental health care (medicines, counseling, other), or concerns about moods, please let me know!   Sexual / Reproductive health: if need for STD testing, or if concerns with libido/pain problems, please let me know!   Advanced Directive: Living Will and/or Healthcare Power of Attorney recommended for all adults, regardless of age or health.  Vaccines  Flu vaccine: for almost everyone, every fall.   Shingles vaccine: after age 44.   Pneumonia vaccines: after age 35.  Tetanus booster: every 10 years - due 2030  COVID vaccine: STRONGLY RECOMMENDED  Cancer screenings   Colon cancer screening: age 29-75.   Prostate cancer screening: PSA blood test age 93-71 Infection screenings  . HIV: recommended screening at least once age 65-65 . Gonorrhea/Chlamydia: screening as needed . Hepatitis C: recommended once for everyone age 70-75 . TB: certain at-risk populations, or depending on work requirements and/or travel history Other . Abdominal Aortic Aneurysm: screening with ultrasound recommended once for men age 18-75 who have ever smoked

## 2020-07-02 NOTE — Progress Notes (Signed)
Ricky Marshall is a 39 y.o. male who presents to  [place] at [facility]  today, 07/02/20, seeking care for the following:  . Annual physical, no complaints or concerns today     ASSESSMENT & PLAN with other pertinent findings:  The primary encounter diagnosis was Need for influenza vaccination. Diagnoses of Annual physical exam, History of thyroid disorder, Family history of colon cancer, Gilbert syndrome, and Low vitamin D level were also pertinent to this visit.   No results found for this or any previous visit (from the past 24 hour(s)).  No abnormal findings on detailed physical exam   Patient Instructions  General Preventive Care  Most recent routine screening labs: ordered today.   Blood pressure goal 130/80 or less.   Tobacco: don't!   Alcohol: responsible moderation is ok for most adults - if you have concerns about your alcohol intake, please talk to me!   Exercise: as tolerated to reduce risk of cardiovascular disease and diabetes. Strength training will also prevent osteoporosis.   Mental health: if need for mental health care (medicines, counseling, other), or concerns about moods, please let me know!   Sexual / Reproductive health: if need for STD testing, or if concerns with libido/pain problems, please let me know!   Advanced Directive: Living Will and/or Healthcare Power of Attorney recommended for all adults, regardless of age or health.  Vaccines  Flu vaccine: for almost everyone, every fall.   Shingles vaccine: after age 68.   Pneumonia vaccines: after age 72.  Tetanus booster: every 10 years - due 2030  COVID vaccine: STRONGLY RECOMMENDED  Cancer screenings   Colon cancer screening: age 66-75.   Prostate cancer screening: PSA blood test age 42-71 Infection screenings  . HIV: recommended screening at least once age 36-65 . Gonorrhea/Chlamydia: screening as needed . Hepatitis C: recommended once for  everyone age 72-75 . TB: certain at-risk populations, or depending on work requirements and/or travel history Other . Abdominal Aortic Aneurysm: screening with ultrasound recommended once for men age 3-75 who have ever smoked    Orders Placed This Encounter  Procedures  . Flu Vaccine QUAD 6+ mos PF IM (Fluarix Quad PF)  . CBC  . COMPLETE METABOLIC PANEL WITH GFR  . Lipid panel  . TSH  . Bilirubin, fractionated (tot/dir/indir)    No orders of the defined types were placed in this encounter.      Follow-up instructions: Return in about 1 year (around 07/02/2021) for [place] (call week prior to visit for lab orders).                                         BP 138/77 (BP Location: Left Arm, Patient Position: Sitting, Cuff Size: Normal)   Pulse (!) 59   Temp 98.3 F (36.8 C) (Oral)   Wt 211 lb (95.7 kg)   BMI 27.84 kg/m   No outpatient medications have been marked as taking for the 07/02/20 encounter (Office Visit) with Emeterio Reeve, DO.    No results found for this or any previous visit (from the past 72 hour(s)).  No results found.     All questions at time of visit were answered - patient instructed to contact office with any additional concerns or updates.  ER/RTC precautions were reviewed with the patient as applicable.   Please note: voice recognition software was used  to produce this document, and typos may escape review. Please contact Dr. Sheppard Coil for any needed clarifications.

## 2020-07-12 LAB — LIPID PANEL
Cholesterol: 196 mg/dL (ref <200)
HDL: 56 mg/dL (ref >=40)
LDL Cholesterol (Calc): 125 mg/dL (calc) — ABNORMAL HIGH
Non-HDL Cholesterol (Calc): 140 mg/dL (calc) — ABNORMAL HIGH (ref <130)
Total CHOL/HDL Ratio: 3.5 (calc) (ref <5.0)
Triglycerides: 56 mg/dL (ref <150)

## 2020-07-12 LAB — COMPLETE METABOLIC PANEL WITH GFR
AG Ratio: 1.8 (calc) (ref 1.0–2.5)
ALT: 14 U/L (ref 9–46)
AST: 15 U/L (ref 10–40)
Albumin: 4.4 g/dL (ref 3.6–5.1)
Alkaline phosphatase (APISO): 68 U/L (ref 36–130)
BUN: 17 mg/dL (ref 7–25)
CO2: 26 mmol/L (ref 20–32)
Calcium: 9.4 mg/dL (ref 8.6–10.3)
Chloride: 108 mmol/L (ref 98–110)
Creat: 1.17 mg/dL (ref 0.60–1.35)
GFR, Est African American: 90 mL/min/{1.73_m2} (ref >=60)
GFR, Est Non African American: 78 mL/min/{1.73_m2} (ref >=60)
Globulin: 2.5 g/dL (calc) (ref 1.9–3.7)
Glucose, Bld: 88 mg/dL (ref 65–99)
Potassium: 4.7 mmol/L (ref 3.5–5.3)
Sodium: 142 mmol/L (ref 135–146)
Total Bilirubin: 1.5 mg/dL — ABNORMAL HIGH (ref 0.2–1.2)
Total Protein: 6.9 g/dL (ref 6.1–8.1)

## 2020-07-12 LAB — BILIRUBIN, FRACTIONATED(TOT/DIR/INDIR)
Bilirubin, Direct: 0.3 mg/dL — ABNORMAL HIGH (ref 0.0–0.2)
Indirect Bilirubin: 1.2 mg/dL (calc) (ref 0.2–1.2)
Total Bilirubin: 1.5 mg/dL — ABNORMAL HIGH (ref 0.2–1.2)

## 2020-07-12 LAB — CBC
HCT: 47.1 % (ref 38.5–50.0)
Hemoglobin: 15.9 g/dL (ref 13.2–17.1)
MCH: 30.8 pg (ref 27.0–33.0)
MCHC: 33.8 g/dL (ref 32.0–36.0)
MCV: 91.1 fL (ref 80.0–100.0)
MPV: 11.3 fL (ref 7.5–12.5)
Platelets: 204 10*3/uL (ref 140–400)
RBC: 5.17 10*6/uL (ref 4.20–5.80)
RDW: 11.7 % (ref 11.0–15.0)
WBC: 5.6 10*3/uL (ref 3.8–10.8)

## 2020-07-12 LAB — TSH: TSH: 1.7 mIU/L (ref 0.40–4.50)
